# Patient Record
Sex: Female | Born: 1965 | Race: Black or African American | Hispanic: No | Marital: Single | Smoking: Former smoker
Health system: Southern US, Community
[De-identification: ages and names within clinical notes are randomized; demographics above are authoritative.]

## PROBLEM LIST (undated history)

## (undated) DIAGNOSIS — B079 Viral wart, unspecified: Secondary | ICD-10-CM

## (undated) HISTORY — DX: Viral wart, unspecified: B07.9

---

## 1998-08-11 ENCOUNTER — Emergency Department (HOSPITAL_COMMUNITY): Admission: EM | Admit: 1998-08-11 | Discharge: 1998-08-11 | Payer: Self-pay | Admitting: Emergency Medicine

## 1999-11-24 ENCOUNTER — Ambulatory Visit (HOSPITAL_COMMUNITY): Admission: RE | Admit: 1999-11-24 | Discharge: 1999-11-24 | Payer: Self-pay | Admitting: *Deleted

## 1999-11-24 ENCOUNTER — Encounter: Payer: Self-pay | Admitting: *Deleted

## 1999-12-27 ENCOUNTER — Ambulatory Visit (HOSPITAL_COMMUNITY): Admission: RE | Admit: 1999-12-27 | Discharge: 1999-12-27 | Payer: Self-pay | Admitting: *Deleted

## 2000-02-22 ENCOUNTER — Encounter: Admission: RE | Admit: 2000-02-22 | Discharge: 2000-05-22 | Payer: Self-pay | Admitting: *Deleted

## 2000-02-23 ENCOUNTER — Ambulatory Visit (HOSPITAL_COMMUNITY): Admission: RE | Admit: 2000-02-23 | Discharge: 2000-02-23 | Payer: Self-pay | Admitting: *Deleted

## 2000-02-29 ENCOUNTER — Encounter: Admission: RE | Admit: 2000-02-29 | Discharge: 2000-02-29 | Payer: Self-pay | Admitting: Obstetrics

## 2000-03-13 ENCOUNTER — Encounter: Admission: RE | Admit: 2000-03-13 | Discharge: 2000-03-13 | Payer: Self-pay | Admitting: Obstetrics & Gynecology

## 2000-04-25 ENCOUNTER — Encounter: Admission: RE | Admit: 2000-04-25 | Discharge: 2000-04-25 | Payer: Self-pay | Admitting: Obstetrics

## 2000-04-25 ENCOUNTER — Encounter: Payer: Self-pay | Admitting: *Deleted

## 2000-04-25 ENCOUNTER — Inpatient Hospital Stay (HOSPITAL_COMMUNITY): Admission: AD | Admit: 2000-04-25 | Discharge: 2000-04-25 | Payer: Self-pay | Admitting: *Deleted

## 2000-04-25 ENCOUNTER — Encounter (HOSPITAL_COMMUNITY): Admission: RE | Admit: 2000-04-25 | Discharge: 2000-06-03 | Payer: Self-pay | Admitting: Obstetrics

## 2000-05-02 ENCOUNTER — Encounter: Admission: RE | Admit: 2000-05-02 | Discharge: 2000-05-02 | Payer: Self-pay | Admitting: Obstetrics

## 2000-05-15 ENCOUNTER — Encounter: Admission: RE | Admit: 2000-05-15 | Discharge: 2000-05-15 | Payer: Self-pay | Admitting: Obstetrics

## 2000-05-29 ENCOUNTER — Encounter: Admission: RE | Admit: 2000-05-29 | Discharge: 2000-05-29 | Payer: Self-pay | Admitting: Obstetrics

## 2000-06-02 ENCOUNTER — Inpatient Hospital Stay (HOSPITAL_COMMUNITY): Admission: AD | Admit: 2000-06-02 | Discharge: 2000-06-05 | Payer: Self-pay | Admitting: *Deleted

## 2000-06-02 ENCOUNTER — Encounter (INDEPENDENT_AMBULATORY_CARE_PROVIDER_SITE_OTHER): Payer: Self-pay | Admitting: Specialist

## 2000-06-08 ENCOUNTER — Inpatient Hospital Stay (HOSPITAL_COMMUNITY): Admission: AD | Admit: 2000-06-08 | Discharge: 2000-06-08 | Payer: Self-pay | Admitting: *Deleted

## 2004-04-28 ENCOUNTER — Encounter: Admission: RE | Admit: 2004-04-28 | Discharge: 2004-04-28 | Payer: Self-pay | Admitting: Family Medicine

## 2004-04-28 ENCOUNTER — Other Ambulatory Visit: Admission: RE | Admit: 2004-04-28 | Discharge: 2004-04-28 | Payer: Self-pay | Admitting: Family Medicine

## 2004-04-28 ENCOUNTER — Encounter (INDEPENDENT_AMBULATORY_CARE_PROVIDER_SITE_OTHER): Payer: Self-pay | Admitting: Specialist

## 2004-06-06 ENCOUNTER — Encounter: Admission: RE | Admit: 2004-06-06 | Discharge: 2004-06-06 | Payer: Self-pay | Admitting: Family Medicine

## 2006-04-28 ENCOUNTER — Encounter (INDEPENDENT_AMBULATORY_CARE_PROVIDER_SITE_OTHER): Payer: Self-pay | Admitting: *Deleted

## 2006-04-28 LAB — CONVERTED CEMR LAB

## 2006-05-03 ENCOUNTER — Ambulatory Visit: Payer: Self-pay | Admitting: Family Medicine

## 2006-05-03 ENCOUNTER — Other Ambulatory Visit: Admission: RE | Admit: 2006-05-03 | Discharge: 2006-05-03 | Payer: Self-pay | Admitting: Family Medicine

## 2006-06-12 ENCOUNTER — Ambulatory Visit: Payer: Self-pay | Admitting: Family Medicine

## 2006-12-26 DIAGNOSIS — E669 Obesity, unspecified: Secondary | ICD-10-CM | POA: Insufficient documentation

## 2006-12-26 DIAGNOSIS — F17200 Nicotine dependence, unspecified, uncomplicated: Secondary | ICD-10-CM

## 2006-12-27 ENCOUNTER — Encounter (INDEPENDENT_AMBULATORY_CARE_PROVIDER_SITE_OTHER): Payer: Self-pay | Admitting: *Deleted

## 2007-02-23 ENCOUNTER — Emergency Department (HOSPITAL_COMMUNITY): Admission: EM | Admit: 2007-02-23 | Discharge: 2007-02-23 | Payer: Self-pay | Admitting: Emergency Medicine

## 2007-03-05 ENCOUNTER — Telehealth (INDEPENDENT_AMBULATORY_CARE_PROVIDER_SITE_OTHER): Payer: Self-pay | Admitting: *Deleted

## 2007-03-06 ENCOUNTER — Ambulatory Visit: Payer: Self-pay | Admitting: Family Medicine

## 2007-03-06 ENCOUNTER — Encounter (INDEPENDENT_AMBULATORY_CARE_PROVIDER_SITE_OTHER): Payer: Self-pay | Admitting: Family Medicine

## 2007-03-06 DIAGNOSIS — L259 Unspecified contact dermatitis, unspecified cause: Secondary | ICD-10-CM | POA: Insufficient documentation

## 2007-03-06 DIAGNOSIS — I10 Essential (primary) hypertension: Secondary | ICD-10-CM | POA: Insufficient documentation

## 2007-03-06 LAB — CONVERTED CEMR LAB
Calcium: 9.3 mg/dL (ref 8.4–10.5)
Chloride: 105 meq/L (ref 96–112)
Creatinine, Ser: 0.8 mg/dL (ref 0.40–1.20)
Glucose, Urine, Semiquant: NEGATIVE
HCT: 38.3 % (ref 36.0–46.0)
Platelets: 297 10*3/uL (ref 150–400)
Protein, U semiquant: NEGATIVE
RDW: 12.7 % (ref 11.5–14.0)
WBC Urine, dipstick: NEGATIVE
pH: 7

## 2007-03-10 ENCOUNTER — Telehealth (INDEPENDENT_AMBULATORY_CARE_PROVIDER_SITE_OTHER): Payer: Self-pay | Admitting: Family Medicine

## 2007-04-04 ENCOUNTER — Encounter (INDEPENDENT_AMBULATORY_CARE_PROVIDER_SITE_OTHER): Payer: Self-pay | Admitting: *Deleted

## 2007-06-23 ENCOUNTER — Encounter: Payer: Self-pay | Admitting: Family Medicine

## 2007-06-23 ENCOUNTER — Other Ambulatory Visit: Admission: RE | Admit: 2007-06-23 | Discharge: 2007-06-23 | Payer: Self-pay | Admitting: Family Medicine

## 2007-06-23 ENCOUNTER — Ambulatory Visit: Payer: Self-pay | Admitting: Family Medicine

## 2007-06-23 DIAGNOSIS — B079 Viral wart, unspecified: Secondary | ICD-10-CM | POA: Insufficient documentation

## 2007-06-23 HISTORY — DX: Viral wart, unspecified: B07.9

## 2007-06-24 ENCOUNTER — Encounter: Payer: Self-pay | Admitting: Family Medicine

## 2007-06-27 ENCOUNTER — Encounter: Payer: Self-pay | Admitting: Family Medicine

## 2007-07-08 ENCOUNTER — Telehealth: Payer: Self-pay | Admitting: Family Medicine

## 2007-07-14 ENCOUNTER — Encounter: Admission: RE | Admit: 2007-07-14 | Discharge: 2007-07-14 | Payer: Self-pay | Admitting: Sports Medicine

## 2007-07-16 ENCOUNTER — Encounter: Payer: Self-pay | Admitting: Family Medicine

## 2007-07-21 ENCOUNTER — Telehealth: Payer: Self-pay | Admitting: Family Medicine

## 2007-07-23 ENCOUNTER — Encounter (INDEPENDENT_AMBULATORY_CARE_PROVIDER_SITE_OTHER): Payer: Self-pay | Admitting: *Deleted

## 2007-08-14 ENCOUNTER — Encounter (INDEPENDENT_AMBULATORY_CARE_PROVIDER_SITE_OTHER): Payer: Self-pay | Admitting: *Deleted

## 2008-01-26 ENCOUNTER — Encounter (INDEPENDENT_AMBULATORY_CARE_PROVIDER_SITE_OTHER): Payer: Self-pay | Admitting: Family Medicine

## 2008-02-24 ENCOUNTER — Encounter (INDEPENDENT_AMBULATORY_CARE_PROVIDER_SITE_OTHER): Payer: Self-pay | Admitting: Family Medicine

## 2008-02-24 ENCOUNTER — Encounter: Payer: Self-pay | Admitting: *Deleted

## 2008-02-24 ENCOUNTER — Ambulatory Visit: Payer: Self-pay | Admitting: Family Medicine

## 2008-02-24 DIAGNOSIS — N912 Amenorrhea, unspecified: Secondary | ICD-10-CM

## 2008-02-24 LAB — CONVERTED CEMR LAB: Whiff Test: POSITIVE

## 2008-02-26 ENCOUNTER — Encounter (INDEPENDENT_AMBULATORY_CARE_PROVIDER_SITE_OTHER): Payer: Self-pay | Admitting: Family Medicine

## 2008-05-31 ENCOUNTER — Telehealth: Payer: Self-pay | Admitting: *Deleted

## 2008-06-17 ENCOUNTER — Ambulatory Visit: Payer: Self-pay | Admitting: Family Medicine

## 2008-06-17 ENCOUNTER — Telehealth: Payer: Self-pay | Admitting: *Deleted

## 2009-02-15 ENCOUNTER — Telehealth (INDEPENDENT_AMBULATORY_CARE_PROVIDER_SITE_OTHER): Payer: Self-pay | Admitting: *Deleted

## 2009-07-12 ENCOUNTER — Ambulatory Visit: Payer: Self-pay | Admitting: Family Medicine

## 2009-07-12 DIAGNOSIS — M545 Low back pain: Secondary | ICD-10-CM

## 2009-08-10 ENCOUNTER — Ambulatory Visit: Payer: Self-pay | Admitting: Family Medicine

## 2009-08-10 DIAGNOSIS — K5900 Constipation, unspecified: Secondary | ICD-10-CM | POA: Insufficient documentation

## 2009-10-14 ENCOUNTER — Emergency Department (HOSPITAL_COMMUNITY): Admission: EM | Admit: 2009-10-14 | Discharge: 2009-10-15 | Payer: Self-pay | Admitting: Emergency Medicine

## 2009-10-26 ENCOUNTER — Ambulatory Visit: Payer: Self-pay | Admitting: Family Medicine

## 2009-10-26 DIAGNOSIS — F410 Panic disorder [episodic paroxysmal anxiety] without agoraphobia: Secondary | ICD-10-CM

## 2010-08-02 ENCOUNTER — Encounter: Payer: Self-pay | Admitting: Family Medicine

## 2010-08-02 ENCOUNTER — Ambulatory Visit: Payer: Self-pay | Admitting: Family Medicine

## 2010-08-03 ENCOUNTER — Ambulatory Visit: Payer: Self-pay | Admitting: Family Medicine

## 2010-08-03 ENCOUNTER — Encounter: Payer: Self-pay | Admitting: Family Medicine

## 2010-08-03 LAB — CONVERTED CEMR LAB
BUN: 12 mg/dL (ref 6–23)
CO2: 28 meq/L (ref 19–32)
Chloride: 100 meq/L (ref 96–112)
Creatinine, Ser: 0.79 mg/dL (ref 0.40–1.20)
Glucose, Bld: 134 mg/dL — ABNORMAL HIGH (ref 70–99)
LDL Cholesterol: 95 mg/dL (ref 0–99)

## 2010-08-15 ENCOUNTER — Encounter: Admission: RE | Admit: 2010-08-15 | Discharge: 2010-08-15 | Payer: Self-pay | Admitting: Family Medicine

## 2010-08-30 ENCOUNTER — Other Ambulatory Visit: Admission: RE | Admit: 2010-08-30 | Discharge: 2010-08-30 | Payer: Self-pay | Admitting: Family Medicine

## 2010-08-30 ENCOUNTER — Ambulatory Visit: Payer: Self-pay | Admitting: Family Medicine

## 2010-08-30 ENCOUNTER — Encounter: Payer: Self-pay | Admitting: Family Medicine

## 2010-08-30 LAB — CONVERTED CEMR LAB
Chlamydia, DNA Probe: NEGATIVE
Pap Smear: NEGATIVE

## 2010-10-03 ENCOUNTER — Encounter: Payer: Self-pay | Admitting: Family Medicine

## 2010-11-06 ENCOUNTER — Ambulatory Visit
Admission: RE | Admit: 2010-11-06 | Discharge: 2010-11-06 | Payer: Self-pay | Source: Home / Self Care | Attending: Family Medicine | Admitting: Family Medicine

## 2010-11-28 NOTE — Assessment & Plan Note (Signed)
Summary: PHYSICAL   Vital Signs:  Patient profile:   45 year old female Height:      65 inches Weight:      224.1 pounds BMI:     37.43 Temp:     98.1 degrees F oral Pulse rate:   86 / minute BP sitting:   111 / 79  (left arm) Cuff size:   regular  Vitals Entered By: Garen Grams LPN (August 02, 2010 9:23 AM) CC: cpe Is Patient Diabetic? No Pain Assessment Patient in pain? no        Primary Care Provider:  Ardeen Garland  MD  CC:  cpe.  History of Present Illness:   panic attacks: States that they are infrequent.  She has only 1 per month.  They usually are in the night and she awakens feeling very anxious.   She denies and CP with episodes.  But states she sometimes feels sob.  She states that she is not an "anxious" person at baseline.  and on most days have no symptoms.  Pt endorse some problems sleeping and having nighttime awakening.  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  BP: 111/79.  states she takes bp medication as directed  weight: Pt states she knows that she needs to lose weight.  She plans on joining gym next week.  States that she would consider going to nutritionist.  She requests weight loss medication but after discussing the side effects and the fact that most people regain all of the weight after stopping the medication she agrees that this isn't a good idea.  Goal weight is 155.  current bmi is 36.2.  cigarette smoking: smokes 6 cig per day.  has thought about quiting.  prevention: refused flu shot, states that she would like to get uptodate on her mammogram  Habits & Providers  Alcohol-Tobacco-Diet     Alcohol drinks/day: <1     Tobacco Status: current     Tobacco Counseling: to quit use of tobacco products     Cigarette Packs/Day: <0.25  Allergies (verified): No Known Drug Allergies  Physical Exam  General:   VSS Well-developed,well-nourished,in no acute distress; alert,appropriate and cooperative throughout examination Lungs:  Normal respiratory effort, chest expands symmetrically. Lungs are clear to auscultation, no crackles or wheezes. Heart:  Normal rate and regular rhythm. S1 and S2 normal without gallop, murmur, click, rub or other extra sounds. Msk:  moving all 4 extremities, 5/5 strength Pulses:  2+ Extremities:  no edema Psych:  Cognition and judgment appear intact. Alert and cooperative with normal attention span and concentration. No apparent delusions, illusions, hallucinations   Impression & Recommendations:  Problem # 1:  PANIC ATTACK (ICD-300.01) Will agree to continue as prescribed by previous provider.  Since this pt has only 1 episode per month she shouldn't need another refill for 1 year.  If she has an increased frequency of episodes and requests more alprazolam, would need to talk to pt more about therapy and consider SSRI.    Her updated medication list for this problem includes:    Alprazolam 0.25 Mg Tabs (Alprazolam) .Marland Kitchen... 1 tab by mouth as needed panic attack  Orders: [facility]- Est  Level 4 (16109)  Problem # 2:  PREVENTIVE HEALTH CARE (ICD-V70.0) Pt will go for mammogram.  She is to make this appt.  Will order labs- cholesterol and BMET  for her to get when fasting.  Pt to return in 1 month for pap so should return prior to her next appt for these labs so we can go over results at her next visit.   Orders: [facility]- Est  Level 4 (99214)  Problem # 3:  OBESITY, NOS (ICD-278.00) pt goal weight 155.  Pt currently 224.  Pt plans to make an appt with the nutritionist to discuss weight loss and nutrition. Will call this week.  Pt plans to join gym next week.    Orders: [facility]- Est  Level 4 (99214)Future Orders: Lipid-[facility] ([id number]) ... 08/30/2011  Problem # 4:  TOBACCO DEPENDENCE (ICD-305.1) Encouraged pt to stop smoking.  Encouraged her to get an appt with Dr. Pascal Lux to have one  on one smoking cessation class. Orders: [facility]- Est  Level 4 (09811)  Problem # 5:  HYPERTENSION, BENIGN ESSENTIAL (ICD-401.1) Well controlled.  Will obtain BMET to check renal function. Her updated medication list for this problem includes:    Hydrochlorothiazide 25 Mg Tabs (Hydrochlorothiazide) .Marland Kitchen... Take 1 tablet by mouth once a day    Metoprolol Succinate 50 Mg Xr24h-tab (Metoprolol succinate) .Marland Kitchen... 1 tab by mouth daily for high blood pressure  Orders: [facility]- Est  Level 4 (99214)Future Orders: Basic Met-[facility] ([id number]) ... 08/02/2011  Complete Medication List: 1)  Hydrochlorothiazide 25 Mg Tabs (Hydrochlorothiazide) .... Take 1 tablet by mouth once a day 2)  Metoprolol Succinate 50 Mg Xr24h-tab (Metoprolol succinate) .Marland Kitchen.. 1 tab by mouth daily for high blood pressure 3)  Alprazolam 0.25 Mg Tabs (Alprazolam) .Marland Kitchen.. 1 tab by mouth as needed panic attack  Other Orders: Mammogram (Screening) (Mammo)  Patient Instructions: 1)  Stop smoking- consider making an appt with Dr. Pascal Lux for smoking cessation 2)  For weight loss your goals are: 3)  To meet with our nutritionist- call for an appt- this week 4)  To get involved with the gym next week 5)  Return in 4-6 weeks for pap smear and to follow up for smoking and weight loss 6)  Come in fasting one day for lab work- call ahead to let know what day Prescriptions: ALPRAZOLAM 0.25 MG TABS (ALPRAZOLAM) 1 tab by mouth as needed panic attack  #30 x 0   Entered and Authorized by:   Ellin Mayhew MD   Signed by:   Ellin Mayhew MD on 08/02/2010   Method used:   Print then Give to Patient   RxID:   [id number]    Prevention & Chronic Care Immunizations   Influenza vaccine: Not documented    Tetanus booster: 06/02/2004: Done.    Pneumococcal vaccine: Not documented  Other Screening   Pap smear: Done.  (04/28/2006)    Mammogram: Not documented   Mammogram action/deferral: Ordered  (08/02/2010)   Smoking status: current   (08/02/2010)   Smoking cessation counseling: yes  (06/17/2008)  Lipids   Total Cholesterol: Not documented   LDL: Not documented   LDL Direct: Not documented   HDL: Not documented   Triglycerides: Not documented  Hypertension   Last Blood Pressure: 111 / 79  (08/02/2010)   Serum creatinine: 0.80  (03/06/2007)   Serum potassium 4.2  (03/06/2007)  Self-Management Support :   Personal Goals (by the next clinic visit) :      Personal blood pressure goal: 140/90  (07/12/2009)   Hypertension self-management support: Not documented    Hypertension self-management support not done because: Not indicated  (10/26/2009)   Nursing Instructions: Schedule screening mammogram (see order)

## 2010-11-28 NOTE — Miscellaneous (Signed)
Summary: [facility] form  Clinical Lists Changes  Orders: Added new Test order of Mammogram (Screening) (Mammo) - Signed Added new Referral order of Nutrition Referral (Nutrition) - Signed Observations: Added new observation of DIABDIETCOMM: nutrition counseling for obesity (08/02/2010 15:27) Added new observation of HTN: HTN (08/02/2010 15:27) Added new observation of LIPSMSUPP: Referred for medical nutrition therapy (08/02/2010 15:27) Added new observation of HTNSMSUPP: Referred for medical nutrition therapy (08/02/2010 15:27) Added new observation of DMSMSUPP: Referred for medical nutrition therapy (08/02/2010 15:27) Added new observation of HTN PROGRESS: At goal (08/02/2010 15:27) Added new observation of HTN FSREVIEW: Yes (08/02/2010 15:27) Added new observation of MAMMRECACT: Ordered (08/02/2010 15:27) Added new observation of NURSEINSTR: Schedule screening mammogram (see order) Refer for medical nutrition therapy (see order)  (08/02/2010 15:27) Added new observation of PAP DUE: 10/07/2010 (08/02/2010 15:27) Added new observation of FLUVAXDECLN: Refused (08/02/2010 15:27) Added new observation of DM PROGRESS: N/A (08/02/2010 15:27) Added new observation of DM FSREVIEW: N/A (08/02/2010 15:27) Added new observation of LIPID PROGRS: N/A (08/02/2010 15:27) Added new observation of LIPID FSREVW: N/A (08/02/2010 15:27)      Prevention & Chronic Care Immunizations   Influenza vaccine: Not documented   Influenza vaccine deferral: Refused  (08/02/2010)    Tetanus booster: 06/02/2004: Done.    Pneumococcal vaccine: Not documented  Other Screening   Pap smear: Done.  (04/28/2006)   Pap smear due: 10/07/2010    Mammogram: Not documented   Mammogram action/deferral: Ordered  (08/02/2010)   Smoking status: current  (08/02/2010)   Smoking cessation counseling: yes  (06/17/2008)  Lipids   Total Cholesterol: Not documented   LDL: Not documented   LDL Direct: Not documented   HDL:  Not documented   Triglycerides: Not documented  Hypertension   Last Blood Pressure: 111 / 79  (08/02/2010)   Serum creatinine: 0.80  (03/06/2007)   Serum potassium 4.2  (03/06/2007)    Hypertension flowsheet reviewed?: Yes   Progress toward BP goal: At goal  Self-Management Support :   Personal Goals (by the next clinic visit) :      Personal blood pressure goal: 140/90  (07/12/2009)  Referred.    Hypertension self-management support: Referred for medical nutrition therapy  (08/02/2010)    Hypertension self-management support not done because: Not indicated  (10/26/2009)   Nursing Instructions: Schedule screening mammogram (see order) Refer for medical nutrition therapy (see order)    Diabetes Self Management Training Referral Patient Name: Cheyenne Mcintyre Date Of Birth: 1966-08-03 MRN: [id number] Current Diagnosis:  PANIC ATTACK (ICD-300.01) CONSTIPATION (ICD-564.00) BACK PAIN, LUMBAR (ICD-724.2) CONTACT OR EXPOSURE TO OTHER VIRAL DISEASES (ICD-V01.79) AMENORRHEA (ICD-626.0) PREVENTIVE HEALTH CARE (ICD-V70.0) VERRUCA VULGARIS (ICD-078.10) DERMATITIS (ICD-692.9) HYPERTENSION, BENIGN ESSENTIAL (ICD-401.1) TOBACCO DEPENDENCE (ICD-305.1) OBESITY, NOS (ICD-278.00)     Management Training Needs:  Please Specify change in Medical condition, treatment or diagnosis nutrition counseling for obesity  Complicating Conditions:  HTN

## 2010-11-28 NOTE — Miscellaneous (Signed)
  Clinical Lists Changes  Problems: Removed problem of SEXUALLY TRANSMITTED DISEASE, EXPOSURE TO (ICD-V01.6) Removed problem of CONTACT OR EXPOSURE TO OTHER VIRAL DISEASES (ICD-V01.79) Removed problem of PREVENTIVE HEALTH CARE (ICD-V70.0)      Allergies: No Known Drug Allergies

## 2010-11-28 NOTE — Assessment & Plan Note (Signed)
Summary: pap/smoking/obesity   Vital Signs:  Patient profile:   45 year old female Weight:      228.5 pounds Pulse rate:   96 / minute BP sitting:   115 / 80  (right arm)  Vitals Entered By: Starleen Blue RN (August 30, 2010 3:39 PM) CC: pap Is Patient Diabetic? No Pain Assessment Patient in pain? no        Primary Care Provider:  Ardeen Garland  MD  CC:  pap.  History of Present Illness: Pt here for pap: Pt had to leave appt early at last visit and we were not able to do it at last appt.  Requests that pap be done today.  smoking: pt is smoking 6-10 cigarettes per day.  Pt states that she knows that she needs to stop.  unprotected sex: Pt states that she has had unprotected sex and would like to be screened for STD's at todays visit.    obesity: Pt states that she knows that she is overweight.  States understanding that weight loss could help control her triglyceride level.  Pt states that she is not currently engaged in   Habits & Providers  Alcohol-Tobacco-Diet     Alcohol drinks/day: <1     Tobacco Status: current     Tobacco Counseling: to quit use of tobacco products     Cigarette Packs/Day: <0.25  Current Medications (verified): 1)  Hydrochlorothiazide 25 Mg Tabs (Hydrochlorothiazide) .... Take 1 Tablet By Mouth Once A Day 2)  Metoprolol Succinate 50 Mg Xr24h-Tab (Metoprolol Succinate) .Marland Kitchen.. 1 Tab By Mouth Daily For High Blood Pressure 3)  Alprazolam 0.25 Mg Tabs (Alprazolam) .Marland Kitchen.. 1 Tab By Mouth As Needed Panic Attack  Allergies (verified): No Known Drug Allergies  Review of Systems       as per hpi  Physical Exam  General:  VSS Well-developed,well-nourished,in no acute distress; alert,appropriate and cooperative throughout examination Lungs:  Normal respiratory effort, chest expands symmetrically. Lungs are clear to auscultation, no crackles or wheezes. Heart:  Normal rate and regular rhythm. S1 and S2 normal without gallop, murmur, click, rub or  other extra sounds. Genitalia:  Pelvic Exam:        External: normal female genitalia without lesions or masses        Vagina: normal without lesions or masses        Cervix: normal without lesions or masses        Adnexa: normal bimanual exam without masses or fullness        Uterus: normal by palpation        Pap smear: performed Extremities:  no edema Skin:  Intact without suspicious lesions or rashes   Impression & Recommendations:  Problem # 1:  SEXUALLY TRANSMITTED DISEASE, EXPOSURE TO (ICD-V01.6) pt tested for GC/Chlam, HIV, and RPR since she had recent unprotected sex.   reviewed the importance of using protection.   Orders: GC/Chlamydia-[facility] ([id number]/[id number]) [facility]- Est  Level 4 (99214)Future Orders: HIV-[facility] ([id number]) ... 09/13/2011 RPR-[facility] [id number]) ... 09/12/2011  Problem # 2:  Screening Cervical Cancer (ICD-V76.2) pap test obtained and sent to lab.   Problem # 3:  TOBACCO DEPENDENCE (ICD-305.1) pt encouraged to quit.  Orders: [facility]- Est  Level 4 (29562)  Problem # 4:  OBESITY, NOS (ICD-278.00) Pt did not follow through on plan to meet with nutritionist or join gym.  Pt encouraged to start physical activity and make appt with the nutritionist.    Orders: Pap Smear-[facility] ([id number]) [facility]- Est  Level 4 (  16109)  Complete Medication List: 1)  Hydrochlorothiazide 25 Mg Tabs (Hydrochlorothiazide) .... Take 1 tablet by mouth once a day 2)  Metoprolol Succinate 50 Mg Xr24h-tab (Metoprolol succinate) .Marland Kitchen.. 1 tab by mouth daily for high blood pressure 3)  Alprazolam 0.25 Mg Tabs (Alprazolam) .Marland Kitchen.. 1 tab by mouth as needed panic attack  Patient Instructions: 1)  I will call you if you have any abnormal results come back.  2)  Consider stopping smoking:1-800-quit-now could be helpful 3)  follow up in 6 months for bp and recheck weight and smoking status.    Orders Added: 1)  GC/Chlamydia-[facility] [[id number]] 2)  Pap Smear-[facility] [[id number]] 3)  [facility]- Est  Level 4  [99214] 4)  HIV-[facility] [[id number]] 5)  RPR-[facility] [[id number]]     Prevention & Chronic Care Immunizations   Influenza vaccine: Not documented   Influenza vaccine deferral: Refused  (08/02/2010)    Tetanus booster: 06/02/2004: Done.    Pneumococcal vaccine: Not documented  Other Screening   Pap smear: Done.  (04/28/2006)   Pap smear due: 10/07/2010    Mammogram: ASSESSMENT: Negative - BI-RADS 1^MM DIGITAL SCREENING  (08/15/2010)   Mammogram action/deferral: Ordered  (08/02/2010)   Smoking status: current  (08/30/2010)   Smoking cessation counseling: yes  (06/17/2008)  Lipids   Total Cholesterol: 170  (08/03/2010)   LDL: 95  (08/03/2010)   LDL Direct: Not documented   HDL: 44  (08/03/2010)   Triglycerides: 156  (08/03/2010)  Hypertension   Last Blood Pressure: 115 / 80  (08/30/2010)   Serum creatinine: 0.79  (08/03/2010)   Serum potassium 4.1  (08/03/2010)    Hypertension flowsheet reviewed?: Yes  Self-Management Support :   Personal Goals (by the next clinic visit) :      Personal blood pressure goal: 140/90  (07/12/2009)   Hypertension self-management support: Written self-care plan  (08/30/2010)   Hypertension self-care plan printed.    Hypertension self-management support not done because: Not indicated  (10/26/2009)   Vital Signs:  Patient profile:   45 year old female Weight:      228.5 pounds Pulse rate:   96 / minute BP sitting:   115 / 80  (right arm)  Vitals Entered By: Starleen Blue RN (August 30, 2010 3:39 PM)

## 2010-11-30 NOTE — Assessment & Plan Note (Signed)
Summary: BP CHECK/KH  Nurse Visit   Vital Signs:  Patient profile:   45 year old female Pulse rate:   76 / minute BP sitting:   142 / 98  (right arm) Cuff size:   large  Vitals Entered By: Tessie Fass CMA (November 06, 2010 2:50 PM) CC: BP check. Pt says she has not been taking BP meds regularly because BP was normal, but will start taking on daily basis.   Allergies: No Known Drug Allergies  Orders Added: 1)  No Charge Patient Arrived (NCPA0) [NCPA0]

## 2010-12-05 ENCOUNTER — Encounter: Payer: Self-pay | Admitting: *Deleted

## 2010-12-28 ENCOUNTER — Ambulatory Visit (INDEPENDENT_AMBULATORY_CARE_PROVIDER_SITE_OTHER): Payer: Medicaid Other | Admitting: *Deleted

## 2010-12-28 VITALS — BP 116/80 | HR 80

## 2010-12-28 DIAGNOSIS — I10 Essential (primary) hypertension: Secondary | ICD-10-CM

## 2010-12-29 NOTE — Progress Notes (Signed)
IN for BP check on 12/28/2010 . BP checked manually using large adult cuff. BP LA 120/80, RA 116/80 pulse 80. Patient reports headache off and on for several days. Advised if continuing to come in to see MD. Advised of need to schedule appointment for follow up.

## 2011-01-29 LAB — URINALYSIS, ROUTINE W REFLEX MICROSCOPIC
Nitrite: NEGATIVE
Specific Gravity, Urine: 1.019 (ref 1.005–1.030)
Urobilinogen, UA: 0.2 mg/dL (ref 0.0–1.0)
pH: 6.5 (ref 5.0–8.0)

## 2011-01-29 LAB — POCT I-STAT, CHEM 8
Chloride: 104 mEq/L (ref 96–112)
HCT: 35 % — ABNORMAL LOW (ref 36.0–46.0)
Hemoglobin: 11.9 g/dL — ABNORMAL LOW (ref 12.0–15.0)
Potassium: 3.5 mEq/L (ref 3.5–5.1)

## 2011-01-29 LAB — POCT PREGNANCY, URINE: Preg Test, Ur: NEGATIVE

## 2011-03-16 NOTE — Op Note (Signed)
[facility]  Patient:    Cheyenne Mcintyre, Cheyenne Mcintyre                      MRN: [id number] Proc. Date: 06/03/00 Adm. Date:  [id number] Disc. Date: [id number] Attending:  Tammi Sou                           Operative Report  PREOPERATIVE DIAGNOSIS:  40+ week intrauterine pregnancy, gestational diabetes, arrest of dilatation active phase, meconium stained amniotic fluid, persistent occiput posterior.  POSTOPERATIVE DIAGNOSIS:  40+ week intrauterine pregnancy, gestational diabetes, arrest of dilatation active phase, meconium stained amniotic fluid, persistent occiput posterior.  Macrosomia.  OPERATION:  Primary low transverse cesarean and modified Pomeroy bilateral tubal ligation via Pfannenstiel incision.  SURGEON:  Roseanna Rainbow, M.D.  ANESTHESIA:  Epidural.  COMPLICATIONS:  None.  ESTIMATED BLOOD LOSS:  800 cc.  FLUIDS:  1500 cc lactated Ringers.  URINE OUTPUT:  300 cc of clear urine.  INDICATIONS:  The patient is a 45 year old multipara at 40+ weeks with arrest of dilatation at 8 to 9 cm and prolonged decelerations with oxytocin.  FINDINGS:  Infant in cephalic presentation.  Meconium with none below the cords.  Pediatrics present at delivery.  Umbilical artery pH 7.26.  Weight 9 pounds, 7 ounces.  Normal uterus, tubes and ovaries.  DESCRIPTION OF PROCEDURE:  The patient was taken to the operating room where epidural anesthetic was found to be adequate.  She was then prepped and draped in normal sterile fashion in dorsal supine position with leftward tilt.  A Pfannenstiel skin ncision was then made with the scalpel and carried through to the underlying layer of fascia.  The fascia was incised in the midline and the incision extended laterally with Mayo scissors.  The superior aspect of the fascial incision was then grasped with Kocher clamps, elevated and the underlying rectus muscles dissected off.  Attention was then turned to  the inferior aspect of the incision which was manipulatd in similar fashion.  The rectus muscles were separated in the midline and the parietal peritoneum identified, tented up and entered sharply with Metzenbaum scissors.  The peritoneal incision was then extended superiorly and inferiorly with good visualization of the bladder.  The bladder blade was then inserted and the vesicouterine peritoneum identified and grasped with the pickups and entered sharply with Metzenbaum scissors.  This incision was then extended laterally and the bladder flap created digitally.  The bladder blade was then reinserted and the lower uterine segment incised in a transverse fashion with the scalpel.  The uterine incision was then extended bluntly.  The bladder blade was removed and the infants head delivered atraumatically.  The nose and mouth were suctioned with the DeLee suction trap and the cord clamped and cut. The infant was handed off to the awaiting pediatricians.  Cord gases were sent.  The placenta was removed.  The uterus was exteriorized, and cleared of all clots and debris.  The uterine incision was then repaired with 0 Monocryl in a running locked fashio.  Interrupted figure-of-eight sutures of the same were used to obtain excellent hemostasis.  Attention was then turned to the fallopian tubes.  The left fallopian tube was then grasped approximately 4 cm away from the cornua.  A 2 cm segment of tube was then doubly ligated with free ties of plain gut and excised.  The right fallopian tube was manipulated in a similar  fashion.  The uterus was then returned to the abdomen and the gutters were cleared of all clots.  The tubal ligatures were then visualized for hemostasis.  One of the right ligatures was felt to be loose and a third ligature was placed for security.  Excellent hemostasis was noted. The fascia was reapproximated with 0 Vicryl.  The skin was closed with staples.  The patient tolerated  the procedure well.  Sponge, lap and needle counts were correct x 2.  The patient was taken to the post anesthesia care unit in stable condition. DD:  06/03/00 TD:  06/04/00 Job: [id number]

## 2011-03-16 NOTE — Discharge Summary (Signed)
[facility]  Patient:    Cheyenne Mcintyre, Cheyenne Mcintyre                      MRN: [id number] Adm. Date:  [id number] Disc. Date: [id number] Attending:  Michaelle Copas Dictator:   Pricilla Holm, M.D.                           Discharge Summary  ADMISSION DIAGNOSIS:          A 45 year old gravida 7, para 3, 0 0 3 at 40-5/7 weeks presents with contractions.  DISCHARGE DIAGNOSIS:          Delivery of a viable female via cesarean section and bilateral tubal ligation.  PROCEDURES:                   Low transverse C-section and bilateral salpingo-oophorectomy.  HOSPITAL COURSE:              Ms. [name] is 45 year old G7, P3, 0 0 3 that presented at 40-5/7 weeks by first-trimester ultrasound on August 02, 2000. She was complaining of contractions.  She came in to be evaluated.  The patient was admitted to L&D and was found to have a group B Strep positive finding.  The patient labored without any problems and was found to eventually have meconium-stained amniotic fluid after her water broke.  The patient also has gestational diabetes that has been complicating her pregnancy.  She eventually ended having arrest of her dilatation, active phase, coupled with the meconium-stained amniotic fluid and persistent occipitoposterior and macrosomia.  The patient was decided to be taken back for a low transverse C-section and modified Pomeroy bilateral tubal ligation via Pfannenstiel incision.  The above operative procedures were performed by Dr. Roseanna Rainbow under epidural anesthesia without complications.  The patient delivered a viable female via C-section weighing 9 pounds 7 ounces, length 22 inches, along with Apgars 9 at one minute and 9 at five minutes.  The patient tolerated the C-section and bilateral salpingo-oophorectomy without any problems.  She received routine post C-section care.  The patient has been both breast and bottle feeding.  She was found to  have marijuana in her urine drug screen and has subsequently been supplementing the baby with formula. The babys urine was found to be negative and a stool sample was sent for drug screen.  The patient was found to be in good condition on her postop day #3 and it was deemed appropriate to send her home.  Her hemoglobin was 11.7 with a 33.3 hematocrit on June 02, 2000 and hemoglobin of 9.2 and a hematocrit of 26.2 on June 03, 2000.  She will be following up in six weeks for her postpartum visit with womens health and the patient has been given instructions for how to care for her incision and will return in three days to have her staples removed.  DISCHARGE INSTRUCTIONS:       The patient was given discharge instructions. She voiced agreement and understanding of the instructions given.  DISCHARGE MEDICATIONS:        She was given a prescription for Vicodin 1-2 tablets q.6h. p.r.n. pain #15.  FOLLOW-UP:                    The patient voiced agreement and understanding and will return in three days for staple removal. DD:  06/05/00 TD:  06/06/00 Job: [id number]

## 2011-03-27 ENCOUNTER — Ambulatory Visit (INDEPENDENT_AMBULATORY_CARE_PROVIDER_SITE_OTHER): Payer: Medicaid Other | Admitting: Family Medicine

## 2011-03-27 ENCOUNTER — Encounter: Payer: Self-pay | Admitting: Family Medicine

## 2011-03-27 VITALS — BP 148/98 | HR 93 | Temp 98.2°F | Ht 65.0 in | Wt 222.0 lb

## 2011-03-27 DIAGNOSIS — N898 Other specified noninflammatory disorders of vagina: Secondary | ICD-10-CM

## 2011-03-27 DIAGNOSIS — A5901 Trichomonal vulvovaginitis: Secondary | ICD-10-CM

## 2011-03-27 DIAGNOSIS — N760 Acute vaginitis: Secondary | ICD-10-CM | POA: Insufficient documentation

## 2011-03-27 LAB — POCT WET PREP (WET MOUNT): Yeast Wet Prep HPF POC: NEGATIVE

## 2011-03-27 NOTE — Progress Notes (Signed)
  Subjective:    Patient ID: Cheyenne Mcintyre, female    DOB: [birth year], 45 y.o.   MRN: [id number]  HPI Pt is here for STDs concerns: She is sexually active with one partner of several years.  Does no use condoms.  Found out that her partner has been cheating on her.  He denies having any STDs.  She has history of Gonorhea in the distant past and was treated.  She is still in the relationship with this partner, but thinks that they are breaking up.  She endorses vaginal discharge, that is clear.  She is not sure how many days she's had the discharge.  She is not sure if she's seeing the discharge since finding out about her partner's indiscretion.  LMP: 5/17 - 5/22.  BTL 2001.  She declined HIV, RPR testing today.    Pt would like to be informed by cell phone: [phone].  Ok to leave message.   Review of Systems  Constitutional: Negative for fever, chills and unexpected weight change.  Respiratory: Negative for cough and shortness of breath.   Cardiovascular: Negative for chest pain and leg swelling.  Gastrointestinal: Negative for nausea, vomiting and diarrhea.  Genitourinary: Positive for vaginal discharge. Negative for dysuria, hematuria, vaginal bleeding, genital sores, vaginal pain, menstrual problem, pelvic pain and dyspareunia.       Objective:   Physical Exam  Constitutional: She appears well-developed and well-nourished. No distress.  Abdominal: Soft. Bowel sounds are normal. She exhibits no distension. There is no tenderness.  Genitourinary: There is no rash, tenderness or lesion on the right labia. There is no rash, tenderness or lesion on the left labia. Cervix exhibits discharge. Cervix exhibits no motion tenderness. Right adnexum displays no mass and no tenderness. Left adnexum displays no mass and no tenderness. No erythema, tenderness or bleeding around the vagina. No foreign body around the vagina. Vaginal discharge found.  Lymphadenopathy:       Right: No inguinal  adenopathy present.       Left: No inguinal adenopathy present.          Assessment & Plan:

## 2011-03-27 NOTE — Assessment & Plan Note (Signed)
Pt with concern for STD since finding out that her partner had been sexually indiscreet. She endorsed vaginal discharge.  Wet prep showed +trich.  We also obtained culture for GC/Chlam.  She declined testing for HIV and RPR. Will wait for result of GC/Chalm (likely to be resulted tomorrow) and will treat at the same time as Trich (Flagyl 2000mg  x 1).  Ok to leave message on her cell [phone].

## 2011-03-28 ENCOUNTER — Other Ambulatory Visit: Payer: Self-pay | Admitting: Family Medicine

## 2011-03-28 MED ORDER — METRONIDAZOLE 500 MG PO TABS: ORAL_TABLET | ORAL | Status: AC

## 2011-05-03 ENCOUNTER — Ambulatory Visit: Payer: Medicaid Other | Admitting: Family Medicine

## 2011-05-10 ENCOUNTER — Ambulatory Visit (INDEPENDENT_AMBULATORY_CARE_PROVIDER_SITE_OTHER): Payer: Medicaid Other | Admitting: Family Medicine

## 2011-05-10 ENCOUNTER — Encounter: Payer: Self-pay | Admitting: Family Medicine

## 2011-05-10 DIAGNOSIS — N760 Acute vaginitis: Secondary | ICD-10-CM

## 2011-05-10 DIAGNOSIS — N888 Other specified noninflammatory disorders of cervix uteri: Secondary | ICD-10-CM

## 2011-05-10 DIAGNOSIS — Z8619 Personal history of other infectious and parasitic diseases: Secondary | ICD-10-CM | POA: Insufficient documentation

## 2011-05-10 DIAGNOSIS — Z202 Contact with and (suspected) exposure to infections with a predominantly sexual mode of transmission: Secondary | ICD-10-CM

## 2011-05-10 NOTE — Progress Notes (Signed)
  Subjective:    Patient ID: Cheyenne Mcintyre, female    DOB: 15-Jun-1966, 45 y.o.   MRN: [id number]  HPI Pt requesting recheck for trich: No sex since treatment. Wants to recheck to make sure trich is gone.  No fever. No vaginal discharge.  No foul odor.  Took flagyl as directed for treatment of trich.   Refuses further testing for GC/Chlam since negative at last check and has not had sex since them.  Also had to leave prior to lab draw for hiv and syphilis.    Review of Systems As per above    Objective:   Physical Exam  Constitutional: She appears well-developed and well-nourished.  Pulmonary/Chest: Effort normal. No respiratory distress.  Genitourinary: Vagina normal and uterus normal. There is no rash or lesion on the right labia. There is no rash or lesion on the left labia. Cervix exhibits discharge. Cervix exhibits no motion tenderness and no friability.       Wet prep- negative for trich   Assessment & Plan:

## 2011-05-10 NOTE — Assessment & Plan Note (Signed)
No trichomonas on recheck of wet prep today.  Pt notified of results by phone.

## 2011-05-11 ENCOUNTER — Encounter: Payer: Self-pay | Admitting: Family Medicine

## 2011-06-24 ENCOUNTER — Other Ambulatory Visit: Payer: Self-pay | Admitting: Family Medicine

## 2011-06-25 NOTE — Telephone Encounter (Signed)
Refill request

## 2011-07-27 ENCOUNTER — Emergency Department (HOSPITAL_COMMUNITY)
Admission: EM | Admit: 2011-07-27 | Discharge: 2011-07-27 | Disposition: A | Discharge status: Home / Self Care | Payer: No Typology Code available for payment source | Attending: Emergency Medicine | Admitting: Emergency Medicine

## 2011-07-27 ENCOUNTER — Emergency Department (HOSPITAL_COMMUNITY): Payer: No Typology Code available for payment source

## 2011-07-27 DIAGNOSIS — R0789 Other chest pain: Secondary | ICD-10-CM | POA: Insufficient documentation

## 2011-07-27 DIAGNOSIS — R0609 Other forms of dyspnea: Secondary | ICD-10-CM | POA: Insufficient documentation

## 2011-07-27 DIAGNOSIS — M542 Cervicalgia: Secondary | ICD-10-CM | POA: Insufficient documentation

## 2011-07-27 DIAGNOSIS — I10 Essential (primary) hypertension: Secondary | ICD-10-CM | POA: Insufficient documentation

## 2011-07-27 DIAGNOSIS — R0989 Other specified symptoms and signs involving the circulatory and respiratory systems: Secondary | ICD-10-CM | POA: Insufficient documentation

## 2011-07-30 ENCOUNTER — Ambulatory Visit: Payer: Medicaid Other

## 2011-07-31 ENCOUNTER — Ambulatory Visit: Payer: Medicaid Other

## 2011-09-28 ENCOUNTER — Ambulatory Visit (INDEPENDENT_AMBULATORY_CARE_PROVIDER_SITE_OTHER): Payer: No Typology Code available for payment source | Admitting: Family Medicine

## 2011-09-28 ENCOUNTER — Encounter: Payer: Self-pay | Admitting: Family Medicine

## 2011-09-28 VITALS — BP 140/92 | HR 90 | Temp 98.3°F | Ht 65.0 in | Wt 226.0 lb

## 2011-09-28 DIAGNOSIS — N760 Acute vaginitis: Secondary | ICD-10-CM

## 2011-09-28 DIAGNOSIS — A599 Trichomoniasis, unspecified: Secondary | ICD-10-CM | POA: Insufficient documentation

## 2011-09-28 DIAGNOSIS — Z7251 High risk heterosexual behavior: Secondary | ICD-10-CM | POA: Insufficient documentation

## 2011-09-28 LAB — POCT WET PREP (WET MOUNT)

## 2011-09-28 MED ORDER — METRONIDAZOLE 500 MG PO TABS: ORAL_TABLET | ORAL | Status: AC

## 2011-09-28 NOTE — Assessment & Plan Note (Signed)
Counseled on condom use and need to protect herself against viral illnesses and not just vaginitis. Will check wet prep and cultures for gonorrhea and Chlamydia. She declines HIV and syphilis test today.

## 2011-09-28 NOTE — Assessment & Plan Note (Signed)
Discussed safe sex, alerting partner and him getting treated, avoiding sexual contact until both are treated, risks of pid.

## 2011-09-28 NOTE — Progress Notes (Signed)
  Subjective:    Patient ID: Cheyenne Mcintyre, female    DOB: Aug 27, 1966, 45 y.o.   MRN: [id number]  HPI  Patient here for a work in appointment to discuss recent exposure to STD  Patient has been treated for trichomonas earlier this year. She reports repeated sexual intercourse with this same partner without any condom use. She would like further evaluation for trichomonas, gonorrhea, Chlamydia. She declines HIV and syphilis test today states she recently had this done.  Review of Systems denies fever, chills, vaginal discharge, dysuria, dyspareunia, abdominal pain, vaginal bleeding     Objective:   Physical Exam GEN: NAD Pelvic Exam:        External: normal female genitalia without lesions or masses        Vagina: normal without lesions or masses.  Thin white discharge        Cervix: normal without lesions or masses        Adnexa: normal bimanual exam without masses or fullness        Uterus: normal by palpation        Samples for Wet prep, GC/Chlamydia obtained        Assessment & Plan:

## 2011-09-28 NOTE — Patient Instructions (Addendum)
Will call you with results of wet prep today cell [phone]  Will mail results of gonorrhea and chlamydia next week if normal.  Use condoms always!

## 2011-09-29 LAB — GC/CHLAMYDIA PROBE AMP, GENITAL: Chlamydia, DNA Probe: NEGATIVE

## 2011-10-01 ENCOUNTER — Encounter: Payer: Self-pay | Admitting: Family Medicine

## 2012-04-10 ENCOUNTER — Other Ambulatory Visit: Payer: Self-pay | Admitting: Family Medicine

## 2012-05-12 ENCOUNTER — Ambulatory Visit (INDEPENDENT_AMBULATORY_CARE_PROVIDER_SITE_OTHER): Payer: Self-pay | Admitting: Family Medicine

## 2012-05-12 ENCOUNTER — Telehealth: Payer: Self-pay | Admitting: Family Medicine

## 2012-05-12 ENCOUNTER — Encounter: Payer: Self-pay | Admitting: Family Medicine

## 2012-05-12 ENCOUNTER — Other Ambulatory Visit (HOSPITAL_COMMUNITY)
Admission: RE | Admit: 2012-05-12 | Discharge: 2012-05-12 | Disposition: A | Discharge status: Home / Self Care | Payer: Self-pay | Source: Ambulatory Visit | Attending: Family Medicine | Admitting: Family Medicine

## 2012-05-12 VITALS — BP 160/90 | HR 88 | Ht 61.0 in | Wt 224.5 lb

## 2012-05-12 DIAGNOSIS — Z209 Contact with and (suspected) exposure to unspecified communicable disease: Secondary | ICD-10-CM

## 2012-05-12 DIAGNOSIS — B9689 Other specified bacterial agents as the cause of diseases classified elsewhere: Secondary | ICD-10-CM

## 2012-05-12 DIAGNOSIS — N760 Acute vaginitis: Secondary | ICD-10-CM | POA: Insufficient documentation

## 2012-05-12 DIAGNOSIS — Z113 Encounter for screening for infections with a predominantly sexual mode of transmission: Secondary | ICD-10-CM | POA: Insufficient documentation

## 2012-05-12 DIAGNOSIS — Z7251 High risk heterosexual behavior: Secondary | ICD-10-CM

## 2012-05-12 LAB — POCT WET PREP (WET MOUNT)

## 2012-05-12 MED ORDER — METRONIDAZOLE 500 MG PO TABS: 500.0000 mg | ORAL_TABLET | Freq: Two times a day (BID) | ORAL | Status: AC

## 2012-05-12 NOTE — Progress Notes (Signed)
Subjective:     Patient ID: Cheyenne Mcintyre, female   DOB: Nov 26, 1965, 46 y.o.   MRN: [id number]  HPI 46 yo F with an STD history pertinent for trichomonas a few years ago presents for STD check due to unprotected intercourse with her ex-husband on 04/15/12. She denies vaginal discharge, itching, irritation and pelvic pain.   Review of Systems As per HPI  Objective:   Physical Exam BP 160/90  Pulse 88  Ht 5\' 1"  (1.549 m)  Wt 224 lb 8 oz (101.833 kg)  BMI 42.42 kg/m2  LMP 05/12/2012 General appearance: alert, cooperative and no distress Abdomen: soft, non-tender; bowel sounds normal; no masses,  no organomegaly Pelvic: cervix normal in appearance, external genitalia normal, no adnexal masses or tenderness, no cervical motion tenderness, uterus normal size, shape, and consistency and vagina normal with scant white mucoid discharge.  Assessment and Plan:

## 2012-05-12 NOTE — Assessment & Plan Note (Signed)
A: unprotected sex.  P: checked wet prep, GC and chlamydia. Offered HIV, RPR patient declined. She reports having a recent negative HIV test. Counseled patient to use condoms with every sexual encounter. Provided condoms. Will call with results. Patient report ok to leave VM.

## 2012-05-12 NOTE — Telephone Encounter (Signed)
Called patient clue cells on wet prep. Will treat BV with flagyl per orders. Also updated patient's address.

## 2012-05-12 NOTE — Patient Instructions (Addendum)
Cheyenne Mcintyre,  Thank you for coming in today. I will call you with your wet prep and STD check results.  Please use condoms with every sexual encounter.  Dr. Armen Pickup

## 2012-10-09 ENCOUNTER — Other Ambulatory Visit: Payer: Self-pay | Admitting: Family Medicine

## 2013-03-03 ENCOUNTER — Other Ambulatory Visit: Payer: Self-pay | Admitting: Family Medicine

## 2013-09-09 ENCOUNTER — Other Ambulatory Visit: Payer: Self-pay | Admitting: Family Medicine

## 2014-01-12 ENCOUNTER — Other Ambulatory Visit: Payer: Self-pay | Admitting: *Deleted

## 2014-01-13 MED ORDER — HYDROCHLOROTHIAZIDE 25 MG PO TABS: 25.0000 mg | ORAL_TABLET | Freq: Every day | ORAL | Status: DC

## 2014-05-03 ENCOUNTER — Other Ambulatory Visit: Payer: Self-pay | Admitting: *Deleted

## 2014-05-03 MED ORDER — HYDROCHLOROTHIAZIDE 25 MG PO TABS: 25.0000 mg | ORAL_TABLET | Freq: Every day | ORAL | Status: DC

## 2014-05-03 NOTE — Telephone Encounter (Signed)
Refilled HCTZ. Hasn't been seen here in quite a while. Will ask team to call and request f/u.   Murtis SinkSam Jazmina Muhlenkamp, MD [facility] Resident, PGY-3 05/03/2014, 5:39 PM

## 2014-05-04 NOTE — Telephone Encounter (Signed)
Called and informed patient that she will need an office visit for further refills. She says she has already scheduled the appointment.Cheyenne Mcintyre, [name]

## 2014-05-12 ENCOUNTER — Ambulatory Visit: Payer: Self-pay | Admitting: Family Medicine

## 2014-05-13 ENCOUNTER — Ambulatory Visit: Payer: Self-pay | Admitting: Family Medicine

## 2014-05-26 ENCOUNTER — Ambulatory Visit (INDEPENDENT_AMBULATORY_CARE_PROVIDER_SITE_OTHER): Payer: BC Managed Care – PPO | Admitting: Family Medicine

## 2014-05-26 ENCOUNTER — Encounter: Payer: Self-pay | Admitting: Family Medicine

## 2014-05-26 ENCOUNTER — Other Ambulatory Visit (HOSPITAL_COMMUNITY)
Admission: RE | Admit: 2014-05-26 | Discharge: 2014-05-26 | Disposition: A | Discharge status: Home / Self Care | Payer: BC Managed Care – PPO | Source: Ambulatory Visit | Attending: Family Medicine | Admitting: Family Medicine

## 2014-05-26 VITALS — BP 136/85 | HR 84 | Temp 98.1°F | Ht 61.0 in | Wt 206.2 lb

## 2014-05-26 DIAGNOSIS — Z13220 Encounter for screening for lipoid disorders: Secondary | ICD-10-CM | POA: Diagnosis not present

## 2014-05-26 DIAGNOSIS — Z113 Encounter for screening for infections with a predominantly sexual mode of transmission: Secondary | ICD-10-CM | POA: Diagnosis not present

## 2014-05-26 DIAGNOSIS — Z124 Encounter for screening for malignant neoplasm of cervix: Secondary | ICD-10-CM | POA: Diagnosis not present

## 2014-05-26 DIAGNOSIS — Z01419 Encounter for gynecological examination (general) (routine) without abnormal findings: Secondary | ICD-10-CM | POA: Insufficient documentation

## 2014-05-26 DIAGNOSIS — I10 Essential (primary) hypertension: Secondary | ICD-10-CM

## 2014-05-26 DIAGNOSIS — N760 Acute vaginitis: Secondary | ICD-10-CM | POA: Insufficient documentation

## 2014-05-26 DIAGNOSIS — Z1151 Encounter for screening for human papillomavirus (HPV): Secondary | ICD-10-CM | POA: Insufficient documentation

## 2014-05-26 LAB — COMPREHENSIVE METABOLIC PANEL
ALK PHOS: 58 U/L (ref 39–117)
ALT: 8 U/L (ref 0–35)
AST: 8 U/L (ref 0–37)
Albumin: 3.8 g/dL (ref 3.5–5.2)
BUN: 7 mg/dL (ref 6–23)
CO2: 26 mEq/L (ref 19–32)
CREATININE: 0.63 mg/dL (ref 0.50–1.10)
Calcium: 8.8 mg/dL (ref 8.4–10.5)
Chloride: 99 mEq/L (ref 96–112)
Glucose, Bld: 285 mg/dL — ABNORMAL HIGH (ref 70–99)
Potassium: 4.3 mEq/L (ref 3.5–5.3)
Sodium: 134 mEq/L — ABNORMAL LOW (ref 135–145)
Total Bilirubin: 0.9 mg/dL (ref 0.2–1.2)
Total Protein: 6.2 g/dL (ref 6.0–8.3)

## 2014-05-26 LAB — LIPID PANEL
CHOL/HDL RATIO: 4 ratio
Cholesterol: 182 mg/dL (ref 0–200)
HDL: 46 mg/dL (ref >39)
LDL CALC: 106 mg/dL — AB (ref 0–99)
Triglycerides: 151 mg/dL — ABNORMAL HIGH (ref <150)
VLDL: 30 mg/dL (ref 0–40)

## 2014-05-26 NOTE — Progress Notes (Signed)
Patient ID: Cheyenne BellowsRobin L Mcintyre, female   DOB: 02/05/1966, 48 y.o.   MRN: [id number]  HPI:  Patient presents today for a well woman exam.   Concerns today: none Periods: gets periods monthly, most recent lasted 3 days, usually last 5 days Contraception: hx tubal ligation, also uses condoms every time Pelvic symptoms: no vaginal discharge or pelvic pain Sexual activity: one female partner in the last year STD Screening: would like today Pap smear status: due today, no hx of abnormals Exercise: works out 2 hours each day, trying to lose weight, has lost 25 lbs. Diet: eats healthy  ROS: See HPI  PMFSH:  Cancers in family: no immediate family with hx of cancer  PHYSICAL EXAM: BP 136/85  Pulse 84  Temp(Src) 98.1 F (36.7 C) (Oral)  Ht 5\' 1"  (1.549 m)  Wt 206 lb 3.2 oz (93.532 kg)  BMI 38.98 kg/m2  LMP 05/12/2014  Gen: NAD, pleasant, cooperative HEENT: NCAT, PERRL, no palpable thyromegaly or anterior cervical lymphadenopathy Heart: RRR, no murmurs Lungs: CTAB, NWOB Abdomen: soft, nontender to palpation Neuro: grossly nonfocal, speech normal GU: normal appearing external genitalia without lesions. Vagina is moist with white discharge. Cervix normal in appearance (note cervix VERY posterior with copious vaginal side wall bilaterally, making pap difficult). No cervical motion tenderness or tenderness on bimanual exam. No adnexal masses.   ASSESSMENT/PLAN:  Health maintenance: -STD screening: will check gc/chlamydia/trichomonas, HIV, RPR, acute hepatitis panel today  -pap smear: done today - if insufficient sample may require repeat attempt with vaginal side wall retractor -mammogram: given handout on how to schedule -lipid screening: will do this today (fasting today) -handout given on health maintenance topics  HYPERTENSION, BENIGN ESSENTIAL Well controlled. Continue HCTZ. Check renal fxn/electrolytes today. F/u in 3 months with PCP.   FOLLOW UP: F/u in 3 months for HTN with  PCP.  GrenadaBrittany J. Pollie MeyerMcIntyre, MD [facility]

## 2014-05-26 NOTE — Patient Instructions (Addendum)
See the handout on how to schedule your mammogram. This is an important test to screen for breast cancer. I will call you if your test results are not normal.  Otherwise, I will send you a letter.  If you do not hear from me with in 2 weeks please call our office.       Health Maintenance Adopting a healthy lifestyle and getting preventive care can go a long way to promote health and wellness. Talk with your health care provider about what schedule of regular examinations is right for you. This is a good chance for you to check in with your provider about disease prevention and staying healthy. In between checkups, there are plenty of things you can do on your own. Experts have done a lot of research about which lifestyle changes and preventive measures are most likely to keep you healthy. Ask your health care provider for more information. WEIGHT AND DIET  Eat a healthy diet  Be sure to include plenty of vegetables, fruits, low-fat dairy products, and lean protein.  Do not eat a lot of foods high in solid fats, added sugars, or salt.  Get regular exercise. This is one of the most important things you can do for your health.  Most adults should exercise for at least 150 minutes each week. The exercise should increase your heart rate and make you sweat (moderate-intensity exercise).  Most adults should also do strengthening exercises at least twice a week. This is in addition to the moderate-intensity exercise.  Maintain a healthy weight  Body mass index (BMI) is a measurement that can be used to identify possible weight problems. It estimates body fat based on height and weight. Your health care provider can help determine your BMI and help you achieve or maintain a healthy weight.  For females 66 years of age and older:   A BMI below 18.5 is considered underweight.  A BMI of 18.5 to 24.9 is normal.  A BMI of 25 to 29.9 is considered overweight.  A BMI of 30 and above is considered  obese.  Watch levels of cholesterol and blood lipids  You should start having your blood tested for lipids and cholesterol at 47 years of age, then have this test every 5 years.  You may need to have your cholesterol levels checked more often if:  Your lipid or cholesterol levels are high.  You are older than 47 years of age.  You are at high risk for heart disease.  CANCER SCREENING   Lung Cancer  Lung cancer screening is recommended for adults 22-37 years old who are at high risk for lung cancer because of a history of smoking.  A yearly low-dose CT scan of the lungs is recommended for people who:  Currently smoke.  Have quit within the past 15 years.  Have at least a 30-pack-year history of smoking. A pack year is smoking an average of one pack of cigarettes a day for 1 year.  Yearly screening should continue until it has been 15 years since you quit.  Yearly screening should stop if you develop a health problem that would prevent you from having lung cancer treatment.  Breast Cancer  Practice breast self-awareness. This means understanding how your breasts normally appear and feel.  It also means doing regular breast self-exams. Let your health care provider know about any changes, no matter how small.  If you are in your 20s or 30s, you should have a clinical breast exam (  CBE) by a health care provider every 1-3 years as part of a regular health exam.  If you are 40 or older, have a CBE every year. Also consider having a breast X-ray (mammogram) every year.  If you have a family history of breast cancer, talk to your health care provider about genetic screening.  If you are at high risk for breast cancer, talk to your health care provider about having an MRI and a mammogram every year.  Breast cancer gene (BRCA) assessment is recommended for women who have family members with BRCA-related cancers. BRCA-related cancers  include:  Breast.  Ovarian.  Tubal.  Peritoneal cancers.  Results of the assessment will determine the need for genetic counseling and BRCA1 and BRCA2 testing. Cervical Cancer Routine pelvic examinations to screen for cervical cancer are no longer recommended for nonpregnant women who are considered low risk for cancer of the pelvic organs (ovaries, uterus, and vagina) and who do not have symptoms. A pelvic examination may be necessary if you have symptoms including those associated with pelvic infections. Ask your health care provider if a screening pelvic exam is right for you.   The Pap test is the screening test for cervical cancer for women who are considered at risk.  If you had a hysterectomy for a problem that was not cancer or a condition that could lead to cancer, then you no longer need Pap tests.  If you are older than 65 years, and you have had normal Pap tests for the past 10 years, you no longer need to have Pap tests.  If you have had past treatment for cervical cancer or a condition that could lead to cancer, you need Pap tests and screening for cancer for at least 20 years after your treatment.  If you no longer get a Pap test, assess your risk factors if they change (such as having a new sexual partner). This can affect whether you should start being screened again.  Some women have medical problems that increase their chance of getting cervical cancer. If this is the case for you, your health care provider may recommend more frequent screening and Pap tests.  The human papillomavirus (HPV) test is another test that may be used for cervical cancer screening. The HPV test looks for the virus that can cause cell changes in the cervix. The cells collected during the Pap test can be tested for HPV.  The HPV test can be used to screen women 30 years of age and older. Getting tested for HPV can extend the interval between normal Pap tests from three to five years.  An HPV  test also should be used to screen women of any age who have unclear Pap test results.  After 47 years of age, women should have HPV testing as often as Pap tests.  Colorectal Cancer  This type of cancer can be detected and often prevented.  Routine colorectal cancer screening usually begins at 47 years of age and continues through 47 years of age.  Your health care provider may recommend screening at an earlier age if you have risk factors for colon cancer.  Your health care provider may also recommend using home test kits to check for hidden blood in the stool.  A small camera at the end of a tube can be used to examine your colon directly (sigmoidoscopy or colonoscopy). This is done to check for the earliest forms of colorectal cancer.  Routine screening usually begins at age 50.    Direct examination of the colon should be repeated every 5-10 years through 47 years of age. However, you may need to be screened more often if early forms of precancerous polyps or small growths are found. Skin Cancer  Check your skin from head to toe regularly.  Tell your health care provider about any new moles or changes in moles, especially if there is a change in a mole's shape or color.  Also tell your health care provider if you have a mole that is larger than the size of a pencil eraser.  Always use sunscreen. Apply sunscreen liberally and repeatedly throughout the day.  Protect yourself by wearing long sleeves, pants, a wide-brimmed hat, and sunglasses whenever you are outside. HEART DISEASE, DIABETES, AND HIGH BLOOD PRESSURE   Have your blood pressure checked at least every 1-2 years. High blood pressure causes heart disease and increases the risk of stroke.  If you are between 83 years and 38 years old, ask your health care provider if you should take aspirin to prevent strokes.  Have regular diabetes screenings. This involves taking a blood sample to check your fasting blood sugar  level.  If you are at a normal weight and have a low risk for diabetes, have this test once every three years after 47 years of age.  If you are overweight and have a high risk for diabetes, consider being tested at a younger age or more often. PREVENTING INFECTION  Hepatitis B  If you have a higher risk for hepatitis B, you should be screened for this virus. You are considered at high risk for hepatitis B if:  You were born in a country where hepatitis B is common. Ask your health care provider which countries are considered high risk.  Your parents were born in a high-risk country, and you have not been immunized against hepatitis B (hepatitis B vaccine).  You have HIV or AIDS.  You use needles to inject street drugs.  You live with someone who has hepatitis B.  You have had sex with someone who has hepatitis B.  You get hemodialysis treatment.  You take certain medicines for conditions, including cancer, organ transplantation, and autoimmune conditions. Hepatitis C  Blood testing is recommended for:  Everyone born from [birth year] through 1964.  Anyone with known risk factors for hepatitis C. Sexually transmitted infections (STIs)  You should be screened for sexually transmitted infections (STIs) including gonorrhea and chlamydia if:  You are sexually active and are younger than 47 years of age.  You are older than 47 years of age and your health care provider tells you that you are at risk for this type of infection.  Your sexual activity has changed since you were last screened and you are at an increased risk for chlamydia or gonorrhea. Ask your health care provider if you are at risk.  If you do not have HIV, but are at risk, it may be recommended that you take a prescription medicine daily to prevent HIV infection. This is called pre-exposure prophylaxis (PrEP). You are considered at risk if:  You are sexually active and do not regularly use condoms or know the HIV status  of your partner(s).  You take drugs by injection.  You are sexually active with a partner who has HIV. Talk with your health care provider about whether you are at high risk of being infected with HIV. If you choose to begin PrEP, you should first be tested for HIV. You should then be  tested every 3 months for as long as you are taking PrEP.  PREGNANCY   If you are premenopausal and you may become pregnant, ask your health care provider about preconception counseling.  If you may become pregnant, take 400 to 800 micrograms (mcg) of folic acid every day.  If you want to prevent pregnancy, talk to your health care provider about birth control (contraception). OSTEOPOROSIS AND MENOPAUSE   Osteoporosis is a disease in which the bones lose minerals and strength with aging. This can result in serious bone fractures. Your risk for osteoporosis can be identified using a bone density scan.  If you are 65 years of age or older, or if you are at risk for osteoporosis and fractures, ask your health care provider if you should be screened.  Ask your health care provider whether you should take a calcium or vitamin D supplement to lower your risk for osteoporosis.  Menopause may have certain physical symptoms and risks.  Hormone replacement therapy may reduce some of these symptoms and risks. Talk to your health care provider about whether hormone replacement therapy is right for you.  HOME CARE INSTRUCTIONS   Schedule regular health, dental, and eye exams.  Stay current with your immunizations.   Do not use any tobacco products including cigarettes, chewing tobacco, or electronic cigarettes.  If you are pregnant, do not drink alcohol.  If you are breastfeeding, limit how much and how often you drink alcohol.  Limit alcohol intake to no more than 1 drink per day for nonpregnant women. One drink equals 12 ounces of beer, 5 ounces of wine, or 1 ounces of hard liquor.  Do not use street  drugs.  Do not share needles.  Ask your health care provider for help if you need support or information about quitting drugs.  Tell your health care provider if you often feel depressed.  Tell your health care provider if you have ever been abused or do not feel safe at home. Document Released: 04/30/2011 Document Revised: 03/01/2014 Document Reviewed: 09/16/2013 [employer] Patient Information 2015 [employer]. This information is not intended to replace advice given to you by your health care provider. Make sure you discuss any questions you have with your health care provider.  

## 2014-05-26 NOTE — Assessment & Plan Note (Signed)
Well controlled. Continue HCTZ. Check renal fxn/electrolytes today. F/u in 3 months with PCP.

## 2014-05-27 ENCOUNTER — Encounter: Payer: Self-pay | Admitting: Family Medicine

## 2014-05-27 LAB — RPR

## 2014-05-27 LAB — HEPATITIS PANEL, ACUTE
HCV Ab: NEGATIVE
HEP B C IGM: NONREACTIVE
Hep A IgM: NONREACTIVE
Hepatitis B Surface Ag: NEGATIVE

## 2014-05-27 LAB — HIV ANTIBODY (ROUTINE TESTING W REFLEX): HIV 1&2 Ab, 4th Generation: NONREACTIVE

## 2014-05-28 ENCOUNTER — Telehealth: Payer: Self-pay | Admitting: *Deleted

## 2014-05-28 ENCOUNTER — Encounter: Payer: Self-pay | Admitting: Family Medicine

## 2014-05-28 LAB — CYTOLOGY - PAP

## 2014-05-28 NOTE — Telephone Encounter (Signed)
Spoke with patient and informed her of below results 

## 2014-05-28 NOTE — Telephone Encounter (Signed)
Message copied by Tanna SavoyPROPOSITO, Mingo Siegert S on Fri May 28, 2014  4:34 PM ------      Message from: Tommie SamsOOK, JAYCE G      Created: Fri May 28, 2014 12:26 PM       Please inform of negative pap. ------

## 2014-05-28 NOTE — Telephone Encounter (Signed)
Message copied by Farrell OursEVANS, Suda Forbess K on Fri May 28, 2014  9:25 AM ------      Message from: Tommie SamsOOK, JAYCE G      Created: Thu May 27, 2014  9:21 PM       Please inform patient of negative GC/Chlamydia. ------

## 2014-05-28 NOTE — Telephone Encounter (Signed)
Patient informed.voiced understanding.Cheyenne Mcintyre S  

## 2014-09-15 ENCOUNTER — Other Ambulatory Visit: Payer: Self-pay | Admitting: *Deleted

## 2014-09-16 MED ORDER — HYDROCHLOROTHIAZIDE 25 MG PO TABS: 25.0000 mg | ORAL_TABLET | Freq: Every day | ORAL | Status: DC

## 2014-10-26 ENCOUNTER — Ambulatory Visit (INDEPENDENT_AMBULATORY_CARE_PROVIDER_SITE_OTHER): Payer: BC Managed Care – PPO | Admitting: Family Medicine

## 2014-10-26 ENCOUNTER — Encounter: Payer: Self-pay | Admitting: Family Medicine

## 2014-10-26 VITALS — BP 153/104 | HR 79 | Temp 99.0°F | Ht 61.0 in | Wt 219.7 lb

## 2014-10-26 DIAGNOSIS — N951 Menopausal and female climacteric states: Secondary | ICD-10-CM | POA: Diagnosis not present

## 2014-10-26 NOTE — Assessment & Plan Note (Signed)
Menses spacing out and irregular, hot flashes, vaginal dryness, mood disturbance, none bothersome enough to want treatment - discussed transition and answered questions - reassurance given

## 2014-10-26 NOTE — Patient Instructions (Signed)
Perimenopause Perimenopause is the time when your body begins to move into the menopause (no menstrual period for 12 straight months). It is a natural process. Perimenopause can begin 2-8 years before the menopause and usually lasts for 1 year after the menopause. During this time, your ovaries may or may not produce an egg. The ovaries vary in their production of estrogen and progesterone hormones each month. This can cause irregular menstrual periods, difficulty getting pregnant, vaginal bleeding between periods, and uncomfortable symptoms. SIGNS AND SYMPTOMS   Hot flashes.  Night sweats.  Irregular menstrual periods.  Decreased sex drive.  Vaginal dryness.  Headaches.  Mood swings.  Depression.  Memory problems.  Irritability.  Tiredness.  Weight gain.  Trouble getting pregnant.  The beginning of losing bone cells (osteoporosis).  The beginning of hardening of the arteries (atherosclerosis). DIAGNOSIS  Your health care provider will make a diagnosis by analyzing your age, menstrual history, and symptoms. He or she will do a physical exam and note any changes in your body, especially your female organs. Female hormone tests may or may not be helpful depending on the amount of female hormones you produce and when you produce them. However, other hormone tests may be helpful to rule out other problems. TREATMENT  In some cases, no treatment is needed. The decision on whether treatment is necessary during the perimenopause should be made by you and your health care provider based on how the symptoms are affecting you and your lifestyle. Various treatments are available, such as:  Treating individual symptoms with a specific medicine for that symptom.  Herbal medicines that can help specific symptoms.  Counseling.  Group therapy. HOME CARE INSTRUCTIONS   Keep track of your menstrual periods (when they occur, how heavy they are, how long between periods, and how long they  last) as well as your symptoms and when they started.  Only take over-the-counter or prescription medicines as directed by your health care provider.  Sleep and rest.  Exercise.  Eat a diet that contains calcium (good for your bones) and soy (acts like the estrogen hormone).  Do not smoke.  Avoid alcoholic beverages.  Take vitamin supplements as recommended by your health care provider. Taking vitamin E may help in certain cases.  Take calcium and vitamin D supplements to help prevent bone loss.  Group therapy is sometimes helpful.  Acupuncture may help in some cases. SEEK MEDICAL CARE IF:   You have questions about any symptoms you are having.  You need a referral to a specialist (gynecologist, psychiatrist, or psychologist). SEEK IMMEDIATE MEDICAL CARE IF:   You have vaginal bleeding.  Your period lasts longer than 8 days.  Your periods are recurring sooner than 21 days.  You have bleeding after intercourse.  You have severe depression.  You have pain when you urinate.  You have severe headaches.  You have vision problems. Document Released: 11/22/2004 Document Revised: 08/05/2013 Document Reviewed: 05/14/2013 [facility] Patient Information 2015 [place], [place]. This information is not intended to replace advice given to you by your health care provider. Make sure you discuss any questions you have with your health care provider.

## 2014-10-26 NOTE — Progress Notes (Signed)
   Subjective:    Patient ID: Bynum BellowsRobin L Exantus, female    DOB: 1966-08-14, 48 y.o.   MRN: [id number]  HPI Pt presents for concern that she may be going through menopause. She reports that in October she had her period for 2 days, then it stopped, then it came back for a few days. She has not had a period since then. She has also noted frequent hot flashes, usually at night and has been having vaginal dryness and mood swings. She reports that she is using lubrication which works well. None are bothersome enough for her to want treatment.   Review of Systems See HPI    Objective:   Physical Exam  Constitutional: She is oriented to person, place, and time. She appears well-developed and well-nourished. No distress.  HENT:  Head: Normocephalic and atraumatic.  Eyes: Conjunctivae are normal. Right eye exhibits no discharge. Left eye exhibits no discharge. No scleral icterus.  Cardiovascular: Normal rate.   Pulmonary/Chest: Effort normal.  Abdominal: She exhibits no distension.  Neurological: She is alert and oriented to person, place, and time.  Skin: Skin is warm and dry. No rash noted. She is not diaphoretic.  Psychiatric: She has a normal mood and affect. Her behavior is normal.  Nursing note and vitals reviewed.         Assessment & Plan:

## 2015-02-02 ENCOUNTER — Ambulatory Visit: Payer: Self-pay | Admitting: Family Medicine

## 2015-02-04 ENCOUNTER — Encounter: Payer: Self-pay | Admitting: Family Medicine

## 2015-02-04 ENCOUNTER — Ambulatory Visit (INDEPENDENT_AMBULATORY_CARE_PROVIDER_SITE_OTHER): Payer: BC Managed Care – PPO | Admitting: Family Medicine

## 2015-02-04 VITALS — BP 115/59 | HR 116 | Temp 98.3°F | Ht 65.0 in | Wt 213.0 lb

## 2015-02-04 DIAGNOSIS — R739 Hyperglycemia, unspecified: Secondary | ICD-10-CM

## 2015-02-04 DIAGNOSIS — E1165 Type 2 diabetes mellitus with hyperglycemia: Secondary | ICD-10-CM | POA: Diagnosis not present

## 2015-02-04 DIAGNOSIS — IMO0002 Reserved for concepts with insufficient information to code with codable children: Secondary | ICD-10-CM

## 2015-02-04 LAB — POCT GLYCOSYLATED HEMOGLOBIN (HGB A1C): Hemoglobin A1C: 12.7

## 2015-02-04 LAB — BASIC METABOLIC PANEL WITH GFR
BUN: 12 mg/dL (ref 6–23)
CHLORIDE: 87 meq/L — AB (ref 96–112)
CO2: 30 meq/L (ref 19–32)
Calcium: 9.3 mg/dL (ref 8.4–10.5)
Creat: 0.78 mg/dL (ref 0.50–1.10)
GFR, Est African American: >89 mL/min
GFR, Est Non African American: >89 mL/min
Glucose, Bld: 412 mg/dL — ABNORMAL HIGH (ref 70–99)
Potassium: 5.5 mEq/L — ABNORMAL HIGH (ref 3.5–5.3)
Sodium: 131 mEq/L — ABNORMAL LOW (ref 135–145)

## 2015-02-04 LAB — CBC
HEMATOCRIT: 41.7 % (ref 36.0–46.0)
Hemoglobin: 14 g/dL (ref 12.0–15.0)
MCH: 29.9 pg (ref 26.0–34.0)
MCHC: 33.6 g/dL (ref 30.0–36.0)
MCV: 88.9 fL (ref 78.0–100.0)
MPV: 10 fL (ref 8.6–12.4)
PLATELETS: 262 10*3/uL (ref 150–400)
RBC: 4.69 MIL/uL (ref 3.87–5.11)
RDW: 13 % (ref 11.5–15.5)
WBC: 4.8 10*3/uL (ref 4.0–10.5)

## 2015-02-04 LAB — GLUCOSE, CAPILLARY: Glucose-Capillary: 407 mg/dL — ABNORMAL HIGH (ref 70–99)

## 2015-02-04 MED ORDER — METFORMIN HCL 500 MG PO TABS: 500.0000 mg | ORAL_TABLET | Freq: Two times a day (BID) | ORAL | Status: DC

## 2015-02-04 NOTE — Patient Instructions (Signed)
Metformin 500 twice a day, start tonight.  Drink plenty of water.  Avoid all sugars and carbs over the weekend.  Diet Recommendations for Diabetes   Starchy (carb) foods include: Bread, rice, pasta, potatoes, corn, crackers, bagels, muffins, all baked goods.  (Fruits, milk, and yogurt also have carbohydrate, but most of these foods will not spike your blood sugar as the starchy foods will.)  A few fruits do cause high blood sugars; use small portions of bananas (limit to 1/2 at a time), grapes, and tropical fruits.    Protein foods include: Meat, fish, poultry, eggs, dairy foods, and beans such as pinto and kidney beans (beans also provide carbohydrate).   1. Eat at least 3 meals and 1-2 snacks per day. Never go more than 4-5 hours while awake without eating.  2. Limit starchy foods to TWO per meal and ONE per snack. ONE portion of a starchy  food is equal to the following:   - ONE slice of bread (or its equivalent, such as half of a hamburger bun).   - 1/2 cup of a "scoopable" starchy food such as potatoes or rice.   - 15 grams of carbohydrate as shown on food label.  3. Both lunch and dinner should include a protein food, a carb food, and vegetables.   - Obtain twice as many veg's as protein or carbohydrate foods for both lunch and dinner.   - Fresh or frozen veg's are best.   - Try to keep frozen veg's on hand for a quick vegetable serving.    4. Breakfast should always include protein.

## 2015-02-04 NOTE — Assessment & Plan Note (Signed)
New diagnosis, A1c 12.7, glucose in office 401. Her current generalized symptoms including dry mouth would be explained by prolonged hyperglycemia. Overall she appears well on exam. Discussed new diagnosis, did brief counseling regarding mechanism of disease, importance to cut out all carbohydrate and sugars over the weekend and start metformin tonight (most recent Cr was normal in July 2015). F/u ASAP next week for further discussion. Also drew Bmet, CBC in visit today.

## 2015-02-04 NOTE — Progress Notes (Signed)
   Subjective:    Patient ID: Cheyenne Mcintyre, female    DOB: Dec 04, 1965, 49 y.o.   MRN: [id number]  HPI  CC: lightheaded  # Lightheaded/blurry vision/sick:  Cough Thursday/Friday. Non-productive  Took coricedin  Friday developed pain in abdomen from coughing  Lightheaded for the last few days, didn't go to work  Vision blurry.   Mouth has been very dry, she went and bought mouth spray to try and keep it moist  Menstrual cycle started 2 days ago, ?perimenopausal, last period before that was 3/25. Some hot flashes.   No pain.   2 cups of orange juice, water yesterday.  ROS: felt some fevers (hot flashes?), no nausea or vomiting. Had a leg cramp but went away.    Review of Systems   See HPI for ROS. All other systems reviewed and are negative.  Past medical history, surgical, family, and social history reviewed and updated in the EMR as appropriate. Objective:  BP 115/59 mmHg  Pulse 116  Temp(Src) 98.3 F (36.8 C) (Oral)  Ht 5\' 5"  (1.651 m)  Wt 213 lb (96.616 kg)  BMI 35.44 kg/m2  LMP 02/01/2015 (Exact Date) Vitals and nursing note reviewed  General: NAD CV: tachycardic, regular rhythm, normal heart sounds, no murmurs. 2+ radial pulses Resp: clear bilaterally. No w/r/c. Cough present once during visit  Assessment & Plan:  See Problem List Documentation

## 2015-02-28 ENCOUNTER — Telehealth: Payer: Self-pay | Admitting: Family Medicine

## 2015-02-28 NOTE — Telephone Encounter (Signed)
Called to follow up with patient. New diagnosis of diabetes. If pt returns call please let her know I was checking to see how she was feeling and to encourage her to schedule a follow up appt with PCP to discuss new diagnosis. -Dr. Waynetta SandyWight

## 2015-06-03 ENCOUNTER — Ambulatory Visit (INDEPENDENT_AMBULATORY_CARE_PROVIDER_SITE_OTHER): Payer: BC Managed Care – PPO | Admitting: Internal Medicine

## 2015-06-03 ENCOUNTER — Encounter: Payer: Self-pay | Admitting: Internal Medicine

## 2015-06-03 VITALS — BP 141/96 | HR 92 | Temp 97.9°F | Ht 65.0 in | Wt 192.0 lb

## 2015-06-03 DIAGNOSIS — E1165 Type 2 diabetes mellitus with hyperglycemia: Secondary | ICD-10-CM | POA: Diagnosis not present

## 2015-06-03 DIAGNOSIS — Z87891 Personal history of nicotine dependence: Secondary | ICD-10-CM | POA: Insufficient documentation

## 2015-06-03 DIAGNOSIS — IMO0002 Reserved for concepts with insufficient information to code with codable children: Secondary | ICD-10-CM

## 2015-06-03 DIAGNOSIS — E785 Hyperlipidemia, unspecified: Secondary | ICD-10-CM | POA: Diagnosis not present

## 2015-06-03 DIAGNOSIS — I10 Essential (primary) hypertension: Secondary | ICD-10-CM

## 2015-06-03 DIAGNOSIS — Z789 Other specified health status: Secondary | ICD-10-CM

## 2015-06-03 LAB — POCT GLYCOSYLATED HEMOGLOBIN (HGB A1C): Hemoglobin A1C: 6.1

## 2015-06-03 NOTE — Assessment & Plan Note (Signed)
Obesity - 191lbs  - Lost 20lbs

## 2015-06-03 NOTE — Patient Instructions (Addendum)
Thank you for coming in today. Congratulations on quitting smoking. I want to see you back in 3 months to follow up with your diabetes and your cholesterol. You are doing a great job with your exercise and diet. Keep up the good work.    Diabetes and Exercise Exercising regularly is important. It is not just about losing weight. It has many health benefits, such as:  Improving your overall fitness, flexibility, and endurance.  Increasing your bone density.  Helping with weight control.  Decreasing your body fat.  Increasing your muscle strength.  Reducing stress and tension.  Improving your overall health. People with diabetes who exercise gain additional benefits because exercise:  Reduces appetite.  Improves the body's use of blood sugar (glucose).  Helps lower or control blood glucose.  Decreases blood pressure.  Helps control blood lipids (such as cholesterol and triglycerides).  Improves the body's use of the hormone insulin by:  Increasing the body's insulin sensitivity.  Reducing the body's insulin needs.  Decreases the risk for heart disease because exercising:  Lowers cholesterol and triglycerides levels.  Increases the levels of good cholesterol (such as high-density lipoproteins [HDL]) in the body.  Lowers blood glucose levels. YOUR ACTIVITY PLAN  Choose an activity that you enjoy and set realistic goals. Your health care provider or diabetes educator can help you make an activity plan that works for you. Exercise regularly as directed by your health care provider. This includes:  Performing resistance training twice a week such as push-ups, sit-ups, lifting weights, or using resistance bands.  Performing 150 minutes of cardio exercises each week such as walking, running, or playing sports.  Staying active and spending no more than 90 minutes at one time being inactive. Even short bursts of exercise are good for you. Three 10-minute sessions spread  throughout the day are just as beneficial as a single 30-minute session. Some exercise ideas include:  Taking the dog for a walk.  Taking the stairs instead of the elevator.  Dancing to your favorite song.  Doing an exercise video.  Doing your favorite exercise with a friend. RECOMMENDATIONS FOR EXERCISING WITH TYPE 1 OR TYPE 2 DIABETES   Check your blood glucose before exercising. If blood glucose levels are greater than 240 mg/dL, check for urine ketones. Do not exercise if ketones are present.  Avoid injecting insulin into areas of the body that are going to be exercised. For example, avoid injecting insulin into:  The arms when playing tennis.  The legs when jogging.  Keep a record of:  Food intake before and after you exercise.  Expected peak times of insulin action.  Blood glucose levels before and after you exercise.  The type and amount of exercise you have done.  Review your records with your health care provider. Your health care provider will help you to develop guidelines for adjusting food intake and insulin amounts before and after exercising.  If you take insulin or oral hypoglycemic agents, watch for signs and symptoms of hypoglycemia. They include:  Dizziness.  Shaking.  Sweating.  Chills.  Confusion.  Drink plenty of water while you exercise to prevent dehydration or heat stroke. Body water is lost during exercise and must be replaced.  Talk to your health care provider before starting an exercise program to make sure it is safe for you. Remember, almost any type of activity is better than none. Document Released: 01/05/2004 Document Revised: 03/01/2014 Document Reviewed: 03/24/2013 [facility] Patient Information 2015 [place], [place]. This information is  not intended to replace advice given to you by your health care provider. Make sure you discuss any questions you have with your health care provider.

## 2015-06-03 NOTE — Assessment & Plan Note (Signed)
Diabetes- HgbA1C 12.7 on 02/14/2015. Received 2 months of 500 mg of metformin -she took all of these pills  - Continue diet and exercise, patient working out  - HgbA1C 6.1 today, patient lost 20 lbs over the past 4 months  - F/u A1C, without metformin in 3 months   - If A1C elevated, consider adding back metformin  - Will get urine microalbumin at next visit   - Diabetic Foot exam negative for neuropathy 5/5 left and right foot  - Eye exam - saw opthamoglist  2 months ago, will need to clarify if patient examined with diabetes diagnosis

## 2015-06-03 NOTE — Assessment & Plan Note (Signed)
HTN. Patient blood pressure 141/96. Patient did not take HCTZ this AM. Previously well controlled.  - Will recheck blood pressure at next visit, goal <140/90 for patients with diabetes - Hyperkalemia K+ = 5.5, F/u BMP -- would expect low K+

## 2015-06-03 NOTE — Progress Notes (Signed)
[facility] [name], MD Phone: [phone]  Subjective:  Patient doing well. She states that when she last came in she was feeling terrible due to her elevated blood sugars. However patient states that she has stopped smoking, began a work out regiment (working out an hour and half daily). She is now only eating whole grain bread. She has cut out pasta and rice. She has stopped wine and beer. She only drinks water these days. She took her metformin for 2 months she was given.   She is continuing to take her HCTZ without issue. However she missed her dose today as she was fasting and didn't realize she could take her pills. Her blood pressure's systolic range in 130s- 120s normally.   All relevant systems were reviewed and were negative unless otherwise noted in the HPI  Past Medical History Reviewed problem list.  Medications- reviewed and updated Current Outpatient Prescriptions  Medication Sig Dispense Refill  . Biotin 1000 MCG tablet Take 1,000 mcg by mouth daily.    . Cyanocobalamin 1500 MCG TBDP Take 1 tablet by mouth daily.    . hydrochlorothiazide (HYDRODIURIL) 25 MG tablet Take 1 tablet (25 mg total) by mouth daily. 30 tablet 11  . GARCINIA CAMBOGIA-CHROMIUM PO Take by mouth daily.    . metFORMIN (GLUCOPHAGE) 500 MG tablet Take 1 tablet (500 mg total) by mouth 2 (two) times daily with a meal. (Patient not taking: Reported on 06/03/2015) 60 tablet 0   No current facility-administered medications for this visit.   Chief complaint-noted No additions to family history Social history- patient is a previous smoker  Objective: BP 141/96 mmHg  Pulse 92  Temp(Src) 97.9 F (36.6 C) (Oral)  Ht  (1.651 m)  Wt 192 lb (87.091 kg)  BMI 31.95 kg/m2 Gen: NAD, alert, cooperative with exam HEENT: NCAT, EOMI, PERRL, Neck: FROM, supple CV: RRR, good S1/S2, no murmur, cap refill <3 Resp: CTABL, no wheezes, non-labored Abd: SNTND, BS present, no guarding  or organomegaly Ext: No edema, warm, normal tone, moves UE/LE spontaneously Neuro: Alert and oriented, No gross deficits Skin: no rashes no lesions   Assessment/Plan: Diabetes- HgbA1C 12.7 on 02/14/2015. Received 2 months of 500 mg of metformin -she took all of these pills  - Continue diet and exercise, patient working out  - HgbA1C 6.1 today, patient lost 20 lbs over the past 4 months  - F/u A1C, without metformin in 3 months   - If A1C elevated, consider adding back metformin  - Will get urine microalbumin at next visit   - Diabetic Foot exam negative for neuropathy 5/5 left and right foot  - Eye exam - saw opthamoglist  2 months ago, will need to clarify if patient examined with diabetes diagnosis    Hyperlipidemia 04/2014 LDL 106. Patient with newly diagnose diabetes  - ASCVD with diabetes recommends a moderate/high intensity statin, without diabetes no recommendation for statin  - F/u lipid panel results and liver panel  - Consider statin therapy   HTN. Patient blood pressure 141/96. Patient did not take HCTZ this AM. Previously well controlled.  - Will recheck blood pressure at next visit, goal <140/90 for patients with diabetes - Hyperkalemia K+ = 5.5, F/u BMP -- would expect low K+  Smoking Cessation - Patient went cold [place] - Patient has quit smoking April 2016  - Congrats Again!  - F/U how is that going?   GYN  - HPV negative, normal pap in 2015,  -  F/U pap smear in 2020  Obesity - 191lbs  - Lost 20lbs

## 2015-06-04 LAB — COMPLETE METABOLIC PANEL WITH GFR
ALBUMIN: 4.3 g/dL (ref 3.6–5.1)
ALT: 13 U/L (ref 6–29)
AST: 12 U/L (ref 10–35)
Alkaline Phosphatase: 61 U/L (ref 33–115)
BILIRUBIN TOTAL: 1 mg/dL (ref 0.2–1.2)
BUN: 11 mg/dL (ref 7–25)
CO2: 25 mmol/L (ref 20–31)
CREATININE: 0.61 mg/dL (ref 0.50–1.10)
Calcium: 9.4 mg/dL (ref 8.6–10.2)
Chloride: 99 mmol/L (ref 98–110)
GFR, Est African American: >89 mL/min (ref >=60)
GFR, Est Non African American: >89 mL/min (ref >=60)
Glucose, Bld: 104 mg/dL — ABNORMAL HIGH (ref 65–99)
Potassium: 3.8 mmol/L (ref 3.5–5.3)
Sodium: 138 mmol/L (ref 135–146)
TOTAL PROTEIN: 6.7 g/dL (ref 6.1–8.1)

## 2015-06-04 LAB — LIPID PANEL
CHOL/HDL RATIO: 3.8 ratio (ref <=5.0)
CHOLESTEROL: 196 mg/dL (ref 125–200)
HDL: 52 mg/dL (ref >=46)
LDL Cholesterol: 123 mg/dL (ref <130)
TRIGLYCERIDES: 103 mg/dL (ref <150)
VLDL: 21 mg/dL (ref <30)

## 2015-06-07 ENCOUNTER — Encounter: Payer: Self-pay | Admitting: Internal Medicine

## 2015-06-08 ENCOUNTER — Encounter: Payer: BC Managed Care – PPO | Admitting: Internal Medicine

## 2015-09-13 ENCOUNTER — Other Ambulatory Visit: Payer: Self-pay | Admitting: Family Medicine

## 2015-10-14 ENCOUNTER — Other Ambulatory Visit: Payer: Self-pay | Admitting: *Deleted

## 2015-10-14 MED ORDER — HYDROCHLOROTHIAZIDE 25 MG PO TABS: 25.0000 mg | ORAL_TABLET | Freq: Every day | ORAL | Status: DC

## 2015-12-07 ENCOUNTER — Telehealth: Payer: Self-pay | Admitting: Internal Medicine

## 2015-12-07 NOTE — Telephone Encounter (Signed)
Pt called and would like a colon cleanse. She would like [place] to do a referral if this is something that her insurance would cover. jw

## 2015-12-07 NOTE — Telephone Encounter (Signed)
Spoke with patient, she states she was looking for a recommendation not a referral. Informed patient that we do not routinely refer to these types of places and as such cannot offer any reccomendations. Patient states she will check online.

## 2016-05-22 ENCOUNTER — Encounter: Payer: BC Managed Care – PPO | Admitting: Internal Medicine

## 2016-06-04 ENCOUNTER — Encounter: Payer: BC Managed Care – PPO | Admitting: Internal Medicine

## 2016-06-12 ENCOUNTER — Ambulatory Visit (INDEPENDENT_AMBULATORY_CARE_PROVIDER_SITE_OTHER): Payer: BC Managed Care – PPO | Admitting: Internal Medicine

## 2016-06-12 ENCOUNTER — Encounter: Payer: Self-pay | Admitting: Internal Medicine

## 2016-06-12 VITALS — BP 145/87 | HR 87 | Temp 98.6°F | Ht 65.0 in | Wt 209.0 lb

## 2016-06-12 DIAGNOSIS — E1165 Type 2 diabetes mellitus with hyperglycemia: Secondary | ICD-10-CM | POA: Diagnosis not present

## 2016-06-12 DIAGNOSIS — IMO0001 Reserved for inherently not codable concepts without codable children: Secondary | ICD-10-CM

## 2016-06-12 DIAGNOSIS — I10 Essential (primary) hypertension: Secondary | ICD-10-CM | POA: Diagnosis not present

## 2016-06-12 LAB — BASIC METABOLIC PANEL WITH GFR
BUN: 7 mg/dL (ref 7–25)
CALCIUM: 9.1 mg/dL (ref 8.6–10.2)
CO2: 29 mmol/L (ref 20–31)
Chloride: 93 mmol/L — ABNORMAL LOW (ref 98–110)
Creat: 0.67 mg/dL (ref 0.50–1.10)
GFR, Est African American: >89 mL/min (ref >=60)
Glucose, Bld: 397 mg/dL — ABNORMAL HIGH (ref 65–99)
Potassium: 3.9 mmol/L (ref 3.5–5.3)
Sodium: 131 mmol/L — ABNORMAL LOW (ref 135–146)

## 2016-06-12 LAB — LIPID PANEL
Cholesterol: 188 mg/dL (ref 125–200)
HDL: 42 mg/dL — AB (ref >=46)
LDL CALC: 102 mg/dL (ref <130)
Total CHOL/HDL Ratio: 4.5 Ratio (ref <=5.0)
Triglycerides: 221 mg/dL — ABNORMAL HIGH (ref <150)
VLDL: 44 mg/dL — ABNORMAL HIGH (ref <30)

## 2016-06-12 LAB — POCT GLYCOSYLATED HEMOGLOBIN (HGB A1C): Hemoglobin A1C: 13

## 2016-06-12 MED ORDER — METFORMIN HCL 500 MG PO TABS: 1000.0000 mg | ORAL_TABLET | Freq: Two times a day (BID) | ORAL | 6 refills | Status: DC

## 2016-06-12 NOTE — Progress Notes (Signed)
Cheyenne BellowsRobin L Kataoka is a 50 y.o. female presents to office today for annual physical exam examination.  Concerns today include:  1. CHRONIC DM, Type 2: Reports no concerns. Patient states over the past 2 and half months she has been eating whatever she wants and not exercising because she has been on vacation. Plans to get back on her diet  CBGs Not checked  Taking Metformin twice daily, 1000 mg - stopped for about 3 months, restarted last year thanksgiving  Reports good compliance. Tolerating well w/o side-effects Currently on ACEi / ARB  Lifestyle: Diet  / Exercise  Any hypoglycemia episodes:  Denies polyuria, visual changes, numbness or tingling.  2.  CHRONIC HTN: Reports no issues  Current Meds - HCTZ  Reports good compliance, did not take medications today. Tolerating well, w/o complaints. Lifestyle - Exercise and diet  Denies CP, dyspnea, HA, edema, dizziness / lightheadedness  Last eye exam:  Due for an eye exam, planning to get one   Last dental exam: Not been to dentist in several years.  Last colonoscopy: Screening beginning next year   Last mammogram: Not this year, make an appointment for mammogram  Last pap smear: Every 5 years (2019 next time due) Immunizations needed: TDAP and Pneumoccal vaccine needed - patient would like information on this before decision  Refills needed today: None  Women's Health  Periods: Menstrual periods stopped,  August 2016  Contraception: None  Sexual activity: Yes, 1 partner  STD Screening: none  Exercise: 5 days weeks, 1 hour  Diet: Breakfast: Boiled egg and apple sauce, Lunch: [place] and Kale and spinach salad Dinner: Baked Chicken and brussels sprouts - Typical for patient  Smoking: Previous smoker  Alcohol: 1 glass of wine on the weekend Drugs: None  Advance directives: Information Given  Mood: None     Past Medical History:  Diagnosis Date  . 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 Marital status: Single    Spouse name: N/A  . Number of children: N/A  . Years of education: N/A   Occupational History  . Not on file.   Social History Main Topics  . Smoking status: Current Every Day Smoker    Packs/day: 0.50    Types: Cigarettes  . Smokeless tobacco: Never Used  . Alcohol use Not on file  . Drug use: Unknown  . Sexual activity: Not on file   Other Topics Concern  . Not on file   Social History Narrative  . No narrative on file   No past surgical history on file. No family history on file.  ROS: Review of Systems Review of Systems  Eyes: Negative for blurred vision.  Respiratory: Negative for shortness of breath.   Cardiovascular: Negative for chest pain, palpitations and leg swelling.  Neurological: Negative for dizziness and headaches.  Psychiatric/Behavioral: Negative for depression. The patient is not nervous/anxious.    Vitals:   06/12/16 1053  BP: (!) 145/87  Pulse: 87  Temp: 98.6 F (37 C)    Physical exam Physical Exam  Constitutional: She is oriented to person, place, and time. She appears well-developed and well-nourished.  HENT:  Head: Normocephalic.  Right Ear: External ear normal.  Left Ear: External ear normal.  Mouth/Throat: Oropharynx is clear and moist.  Eyes: Conjunctivae are normal. Pupils are equal, round, and reactive to light.  Neck: Normal range of motion. Neck supple.  No thyromegaly present.  Cardiovascular: Normal rate, regular rhythm, normal heart sounds and intact distal pulses.   No murmur heard. Pulmonary/Chest: Effort normal and breath sounds normal.  Abdominal: Soft. Bowel sounds are normal.  Musculoskeletal: Normal range of motion. She exhibits no edema.  Lymphadenopathy:    She has no cervical adenopathy.  Neurological: She is alert and oriented to person, place, and time. She has normal reflexes.  Skin: Skin is warm. No rash noted.  Psychiatric: She has a normal  mood and affect. Her behavior is normal. Thought content normal.     Diabetic Foot Exam - Simple   Simple Foot Form Diabetic Foot exam was performed with the following findings:  Yes 06/12/2016 11:30 AM  Visual Inspection Sensation Testing Intact to touch and monofilament testing bilaterally:  Yes Pulse Check Posterior Tibialis and Dorsalis pulse intact bilaterally:  Yes Comments     Assessment/ Plan: Patient with T2DM and HTN here for annual physical exam.   Type 2 diabetes mellitus, uncontrolled A1c 13 today, previously 6.1. Patient states that she has been not following her diet over the past 2 and half months. She plans to get back on this diet to bring down her blood sugar -Diabetic foot exam performed today normal -Microalbumin obtained -Discussed the addition of an ACE inhibitor, patient reticent to start any other medication at this point -Metformin increased from 1000 mg to 2000 mg daily - ASCVD risk 13.1%, consider adding a statin and 81 mg of aspirin at next visit -Recommended pneumococcal vaccine, patient declined for now will reconsider in the future after reviewing online - Will make an appointment with ophthalmology for eye exam - Plans to follow up in 2 months after diet changes.    HYPERTENSION, BENIGN ESSENTIAL Blood pressure is noted to be borderline elevated today, but patient did not take medication today. Reminder her to take medication before visit. She states she checks blood pressure regularly and it is always below 140/80 - Continue HCTZ for now, follow up in 2 months     Illana Nolting Cathlean CowerMikell PGY-2, [facility]

## 2016-06-12 NOTE — Assessment & Plan Note (Addendum)
A1c 13 today, previously 6.1. Patient states that she has been not following her diet over the past 2 and half months. She plans to get back on this diet to bring down her blood sugar -Diabetic foot exam performed today normal -Microalbumin obtained -Discussed the addition of an ACE inhibitor, patient reticent to start any other medication at this point -Metformin increased from 1000 mg to 2000 mg daily - ASCVD risk 13.1%, consider adding a statin and 81 mg of aspirin at next visit, discuss again at that point -Recommended pneumococcal vaccine, patient declined for now will reconsider in the future after reviewing online - Will make an appointment with ophthalmology for eye exam - Plans to follow up in 2 months after diet changes.

## 2016-06-12 NOTE — Assessment & Plan Note (Signed)
Blood pressure is noted to be borderline elevated today, but patient did not take medication today. Reminder her to take medication before visit. She states she checks blood pressure regularly and it is always below 140/80 - Continue HCTZ for now, follow up in 2 months

## 2016-06-12 NOTE — Patient Instructions (Signed)
Diabetes Your diabetes is not controlled, currently A1C 13.0  Your goal is to have an A1c < 8.0  Medicine Changes: Metformin 2 pills twice daily  Come back to see us in:  2 months   High Blood Pressure Your BP is controlled Your goal is to have a BP average < 140/80 Medicine: HCTZ, no changes, please take medication next time you come in

## 2016-06-13 LAB — MICROALBUMIN / CREATININE URINE RATIO
Creatinine, Urine: 78 mg/dL (ref 20–320)
Microalb Creat Ratio: 51 mcg/mg creat — ABNORMAL HIGH (ref <30)
Microalb, Ur: 4 mg/dL

## 2016-06-18 ENCOUNTER — Telehealth: Payer: Self-pay | Admitting: Internal Medicine

## 2016-06-18 MED ORDER — ATORVASTATIN CALCIUM 40 MG PO TABS: 40.0000 mg | ORAL_TABLET | Freq: Every day | ORAL | 3 refills | Status: DC

## 2016-06-18 NOTE — Telephone Encounter (Addendum)
Discussed the results of patient's labs. Specifically her elevated microalbuminuria and lipid panel. She is reticent to start any other medications. I recommended a statin due her elevated ASCVD risk.  Furthermore I recommended changing her HCTZ blood pressure medication to lisinopril at her next appointment with me. She stated that I could prescribe the statin, and she would decide whether she wanted to take it or not after further research. She was amenable to the changes in her blood pressure medication.

## 2016-06-18 NOTE — Telephone Encounter (Signed)
Called patient to let her know that she

## 2016-09-18 ENCOUNTER — Encounter: Payer: Self-pay | Admitting: Internal Medicine

## 2016-09-18 ENCOUNTER — Ambulatory Visit (INDEPENDENT_AMBULATORY_CARE_PROVIDER_SITE_OTHER): Payer: BC Managed Care – PPO | Admitting: Internal Medicine

## 2016-09-18 VITALS — BP 116/80 | HR 74 | Temp 98.3°F | Ht 65.0 in | Wt 210.2 lb

## 2016-09-18 DIAGNOSIS — E1165 Type 2 diabetes mellitus with hyperglycemia: Secondary | ICD-10-CM

## 2016-09-18 DIAGNOSIS — IMO0001 Reserved for inherently not codable concepts without codable children: Secondary | ICD-10-CM

## 2016-09-18 LAB — LIPID PANEL
CHOL/HDL RATIO: 4 ratio (ref <5.0)
CHOLESTEROL: 167 mg/dL (ref <200)
HDL: 42 mg/dL — ABNORMAL LOW (ref >50)
LDL CALC: 99 mg/dL (ref <100)
TRIGLYCERIDES: 130 mg/dL (ref <150)
VLDL: 26 mg/dL (ref <30)

## 2016-09-18 LAB — POCT GLYCOSYLATED HEMOGLOBIN (HGB A1C): Hemoglobin A1C: 7.3

## 2016-09-18 NOTE — Patient Instructions (Signed)
You are doing a great job with your diet changes. Please follow up in 3 months, we will recheck labs today

## 2016-09-18 NOTE — Progress Notes (Signed)
   [facility] Cheyenne CharsAsiyah Cindee Mclester, MD Phone: [phone]  Reason For Visit: Diabetes Follow up   CHRONIC DM, Type 2: Reports no concerns CBGs: Does not check  Meds: Taking Metformin 1000 mg twice daily  Reports good compliance. Tolerating well w/o side-effects Lifestyle: Patient has changed diet completely since last appointment, has also been exercising daily Any hypoglycemia episodes: Denies palpations, diaphoresis, fatigue, weakness, jittery Denies polyuria, visual changes, numbness or tingling.  Past Medical History Reviewed problem list.  Medications- reviewed and updated No additions to family history Social history- patient is a non- smoker  Objective: BP 116/80   Pulse 74   Temp 98.3 F (36.8 C) (Oral)   Ht 5\' 5"  (1.651 m)   Wt 210 lb 3.2 oz (95.3 kg)   LMP 02/01/2015 (Exact Date)   BMI 34.98 kg/m  Gen: NAD, alert, cooperative with exam Skin: dry, intact, no rashes or lesions Neuro: Strength and sensation grossly intact  Assessment/Plan: See problem based a/p  Type 2 diabetes mellitus, uncontrolled A1C 13> 7.3 (today),improving. Patient has been exercising and improving her diet. At last visit, she wanted to work on diet and exercise changes and did not want to start ACE, Statin or aspirin  - Medication management -Metformin 1000 mg  - Recheck Lipid, microabuminuria, and A1C   - Please follow up in 3 months  - Patient is not currently on statin, aspirin or, ACE - ASCVD risk based on new lipid profile is 6.2%, less than 7.5% > lipid profile has improved significantly since last visit - Recommend a moderate intensity statin, however patient not interested  - Reviewed criteria for hypoglycemia

## 2016-09-19 LAB — MICROALBUMIN / CREATININE URINE RATIO
Creatinine, Urine: 48 mg/dL (ref 20–320)
Microalb, Ur: <0.2 mg/dL

## 2016-09-25 NOTE — Assessment & Plan Note (Addendum)
A1C 13> 7.3 (today),improving. Patient has been exercising and improving her diet. At last visit, she wanted to work on diet and exercise changes and did not want to start ACE, Statin or aspirin  - Medication management -Metformin 1000 mg  - Recheck Lipid, microabuminuria, and A1C   - Please follow up in 3 months  - Patient is not currently on statin, aspirin or, ACE - ASCVD risk based on new lipid profile is 6.2%, less than 7.5% > lipid profile has improved significantly since last visit - Recommend a moderate intensity statin, however patient not interested  - Reviewed criteria for hypoglycemia

## 2016-09-26 ENCOUNTER — Encounter: Payer: Self-pay | Admitting: Internal Medicine

## 2016-10-02 ENCOUNTER — Encounter: Payer: Self-pay | Admitting: Internal Medicine

## 2016-10-02 ENCOUNTER — Ambulatory Visit (INDEPENDENT_AMBULATORY_CARE_PROVIDER_SITE_OTHER): Payer: BC Managed Care – PPO | Admitting: Internal Medicine

## 2016-10-02 VITALS — BP 136/90 | HR 74 | Temp 98.1°F | Ht 65.0 in | Wt 210.6 lb

## 2016-10-02 DIAGNOSIS — M7989 Other specified soft tissue disorders: Secondary | ICD-10-CM | POA: Diagnosis not present

## 2016-10-02 NOTE — Progress Notes (Signed)
   [facility] Noralee CharsAsiyah Andreka Stucki, MD Phone: [phone]  Reason For Visit: SDA for Left Hand Swelling    # Woke up on Sunday morning with throbbing pain in left thumb.  - Slightly improved from Sunday  - Can move the finger without issues though painful - Felt slight warms, not erythematous  - No cuts or bites around the area  - Soaked esposm salts, has tried [employer] Tirealieve  - Throbbing pain, continuous  - No fever, no chills  - No trauma  - No hx of gout  - No autoimmune disease hx - No skin issues  - No hx of joint swelling   Past Medical History Reviewed problem list.  Medications- reviewed and updated No additions to family history Social history- patient is a non-smoker  Objective: BP 136/90 (BP Location: Right Arm, Patient Position: Sitting, Cuff Size: Normal)   Pulse 74   Temp 98.1 F (36.7 C) (Oral)   Ht 5\' 5"  (1.651 m)   Wt 210 lb 9.6 oz (95.5 kg)   LMP 02/01/2015 (Exact Date)   SpO2 96%   BMI 35.05 kg/m  Gen: NAD, alert, cooperative with exam Left Hand: Swollen left thumb specifically proximal phalanx joint, slightly warm to the touch/erthymetaous,  5/5 strength in the thumb. 2+ radial pulse Skin:slight swollen/erthymetaous proximal interphalangeal joint of 1st digit  Neuro: Strength and sensation grossly intact  Assessment/Plan: See problem based a/p  Swollen finger Slightly swollen left proximal interphalangeal joint most consistent with gout. No concerns for septic joint, able to move joint; no fevers; no possible site of infection. No hx polyarticular arthritis or hx of psoriasis therefore unlikely autoimmune   - will obtain uric acid - Continue ibuprofen every 600 mg every 6 hours for pain  - Holding off on colchicine as needs to be taken with 24 -48 hrs to be effective - discussed that if patient has another exacerbation in the future this might be helpful  - Discussed that HCTZ can exacerbate gout, however patient wanted to hold off on  changing this medication until uric acid is back

## 2016-10-02 NOTE — Assessment & Plan Note (Signed)
Slightly swollen left proximal interphalangeal joint most consistent with gout. No concerns for septic joint, able to move joint; no fevers; no possible site of infection. No hx polyarticular arthritis or hx of psoriasis therefore unlikely autoimmune   - will obtain uric acid - Continue ibuprofen every 600 mg every 6 hours for pain  - Holding off on colchicine as needs to be taken with 24 -48 hrs to be effective - discussed that if patient has another exacerbation in the future this might be helpful  - Discussed that HCTZ can exacerbate gout, however patient wanted to hold off on changing this medication until uric acid is back

## 2016-10-02 NOTE — Patient Instructions (Signed)
I would continue Alieve every 6 hours, gout should start to resolve in 1 week.  I want you to follow up at that time.    Gout Introduction Gout is painful swelling that can happen in some of your joints. Gout is a type of arthritis. This condition is caused by having too much uric acid in your body. Uric acid is a chemical that is made when your body breaks down substances called purines. If your body has too much uric acid, sharp crystals can form and build up in your joints. This causes pain and swelling. Gout attacks can happen quickly and be very painful (acute gout). Over time, the attacks can affect more joints and happen more often (chronic gout). Follow these instructions at home: During a Gout Attack  If directed, put ice on the painful area:  Put ice in a plastic bag.  Place a towel between your skin and the bag.  Leave the ice on for 20 minutes, 2-3 times a day.  Rest the joint as much as possible. If the joint is in your leg, you may be given crutches to use.  Raise (elevate) the painful joint above the level of your heart as often as you can.  Drink enough fluids to keep your pee (urine) clear or pale yellow.  Take over-the-counter and prescription medicines only as told by your doctor.  Do not drive or use heavy machinery while taking prescription pain medicine.  Follow instructions from your doctor about what you can or cannot eat and drink.  Return to your normal activities as told by your doctor. Ask your doctor what activities are safe for you. Avoiding Future Gout Attacks  Follow a low-purine diet as told by a specialist (dietitian) or your doctor. Avoid foods and drinks that have a lot of purines, such as:  Liver.  Kidney.  Anchovies.  Asparagus.  Herring.  Mushrooms  Mussels.  Beer.  Limit alcohol intake to no more than 1 drink a day for nonpregnant women and 2 drinks a day for men. One drink equals 12 oz of beer, 5 oz of wine, or 1 oz of hard  liquor.  Stay at a healthy weight or lose weight if you are overweight. If you want to lose weight, talk with your doctor. It is important that you do not lose weight too fast.  Start or continue an exercise plan as told by your doctor.  Drink enough fluids to keep your pee clear or pale yellow.  Take over-the-counter and prescription medicines only as told by your doctor.  Keep all follow-up visits as told by your doctor. This is important. Contact a doctor if:  You have another gout attack.  You still have symptoms of a gout attack after10 days of treatment.  You have problems (side effects) because of your medicines.  You have chills or a fever.  You have burning pain when you pee (urinate).  You have pain in your lower back or belly. Get help right away if:  You have very bad pain.  Your pain cannot be controlled.  You cannot pee. This information is not intended to replace advice given to you by your health care provider. Make sure you discuss any questions you have with your health care provider. Document Released: 07/24/2008 Document Revised: 03/22/2016 Document Reviewed: 07/28/2015  2017 [employer]

## 2016-10-03 LAB — URIC ACID: Uric Acid, Serum: 4.7 mg/dL (ref 2.5–7.0)

## 2016-10-25 ENCOUNTER — Other Ambulatory Visit: Payer: Self-pay | Admitting: Internal Medicine

## 2016-10-25 ENCOUNTER — Telehealth: Payer: Self-pay

## 2016-10-25 MED ORDER — METFORMIN HCL 500 MG PO TABS: ORAL_TABLET | ORAL | 6 refills | Status: DC

## 2016-10-25 NOTE — Telephone Encounter (Signed)
Needs refill on Metformin. Doesn't have enough for the weekend. Please advise. Sunday SpillersSharon T Saunders, CMA

## 2016-11-11 ENCOUNTER — Other Ambulatory Visit: Payer: Self-pay | Admitting: Internal Medicine

## 2017-05-06 ENCOUNTER — Other Ambulatory Visit: Payer: Self-pay | Admitting: Internal Medicine

## 2017-05-06 MED ORDER — METFORMIN HCL 500 MG PO TABS: ORAL_TABLET | ORAL | 6 refills | Status: DC

## 2017-05-06 MED ORDER — HYDROCHLOROTHIAZIDE 25 MG PO TABS: 25.0000 mg | ORAL_TABLET | Freq: Every day | ORAL | 5 refills | Status: DC

## 2017-05-06 NOTE — Telephone Encounter (Signed)
Pt is calling because she has transferred to a NEW Pharmacy. She is now using [facility] [address]. Can we send in her Metformin and Hydrochlorothiazide. jw

## 2017-09-11 ENCOUNTER — Other Ambulatory Visit: Payer: Self-pay | Admitting: Internal Medicine

## 2017-11-25 ENCOUNTER — Other Ambulatory Visit: Payer: Self-pay | Admitting: Internal Medicine

## 2017-11-25 NOTE — Telephone Encounter (Signed)
Refill sent. Please call patient had have her follow up with PCP. She has not been seen in clinic in a while

## 2017-11-27 NOTE — Telephone Encounter (Signed)
Pt informed. Stated she would call back and make an appt. Denney Shein Bruna PotterBlount, CMA

## 2017-12-30 ENCOUNTER — Other Ambulatory Visit: Payer: Self-pay | Admitting: *Deleted

## 2017-12-30 MED ORDER — METFORMIN HCL 500 MG PO TABS: ORAL_TABLET | ORAL | 3 refills | Status: AC

## 2018-04-22 ENCOUNTER — Other Ambulatory Visit: Payer: Self-pay | Admitting: Internal Medicine

## 2018-06-02 ENCOUNTER — Other Ambulatory Visit: Payer: Self-pay | Admitting: Internal Medicine

## 2018-12-13 ENCOUNTER — Other Ambulatory Visit: Payer: Self-pay | Admitting: Family Medicine

## 2019-01-14 ENCOUNTER — Other Ambulatory Visit: Payer: Self-pay | Admitting: Family Medicine

## 2019-02-13 ENCOUNTER — Other Ambulatory Visit: Payer: Self-pay | Admitting: Family Medicine

## 2019-03-14 ENCOUNTER — Other Ambulatory Visit: Payer: Self-pay | Admitting: Family Medicine

## 2019-03-16 ENCOUNTER — Other Ambulatory Visit: Payer: Self-pay

## 2019-03-16 MED ORDER — METFORMIN HCL 500 MG PO TABS: 1000.0000 mg | ORAL_TABLET | Freq: Two times a day (BID) | ORAL | 1 refills | Status: AC

## 2019-04-12 ENCOUNTER — Other Ambulatory Visit: Payer: Self-pay | Admitting: Family Medicine

## 2019-05-12 ENCOUNTER — Other Ambulatory Visit: Payer: Self-pay | Admitting: Family Medicine

## 2019-05-13 ENCOUNTER — Telehealth: Payer: Self-pay | Admitting: *Deleted

## 2019-05-13 NOTE — Telephone Encounter (Signed)
-----   Message from Nuala Alpha, DO sent at 05/12/2019  3:43 PM EDT ----- Regarding: Patient needs an appointment Can we please call this patient and let her know I will refill her HCTZ but she needs to come in to be seen and have blood work? Thanks!  Tim

## 2019-05-13 NOTE — Telephone Encounter (Signed)
Spoke with pt and she states that she no longer gets care from [place]. Deseree Kennon Holter, CMA

## 2019-08-25 ENCOUNTER — Other Ambulatory Visit: Payer: Self-pay | Admitting: Family Medicine

## 2019-08-25 DIAGNOSIS — Z1231 Encounter for screening mammogram for malignant neoplasm of breast: Secondary | ICD-10-CM

## 2019-10-15 ENCOUNTER — Other Ambulatory Visit: Payer: Self-pay

## 2019-10-15 ENCOUNTER — Ambulatory Visit
Admission: RE | Admit: 2019-10-15 | Discharge: 2019-10-15 | Disposition: A | Discharge status: Home / Self Care | Payer: BC Managed Care – PPO | Source: Ambulatory Visit | Attending: Family Medicine | Admitting: Family Medicine

## 2019-10-15 DIAGNOSIS — Z1231 Encounter for screening mammogram for malignant neoplasm of breast: Secondary | ICD-10-CM

## 2020-07-13 IMAGING — MG DIGITAL SCREENING BILAT W/ TOMO W/ CAD
8 series · 8 of 24 positions shown · non-contrast
Comparison: Previous exam(s).

CLINICAL DATA: Screening.

EXAM:
DIGITAL SCREENING BILATERAL MAMMOGRAM WITH TOMO AND CAD

[R CC synth-2D]
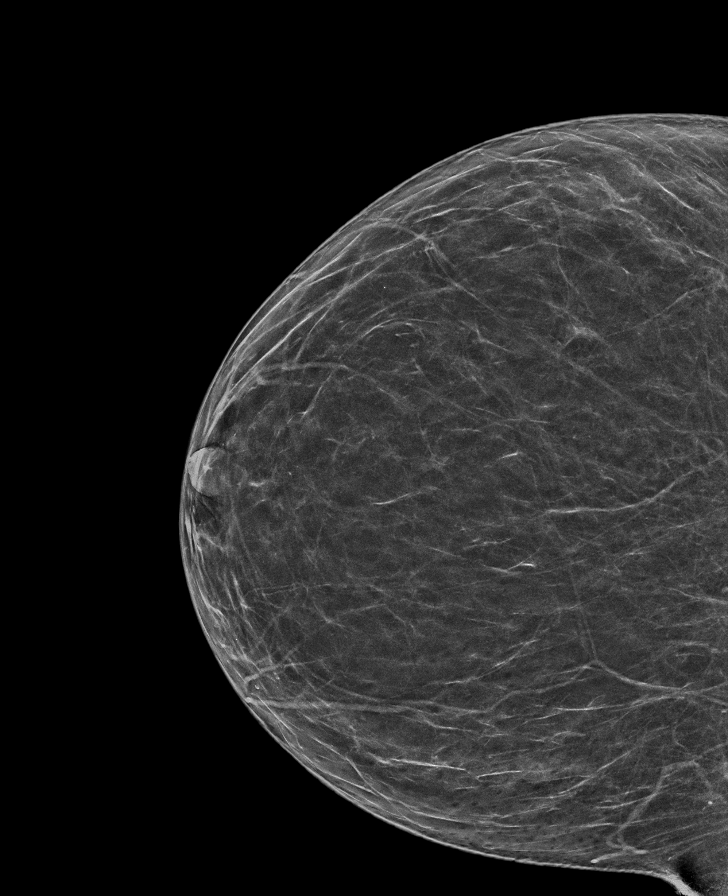

[R MLO synth-2D]
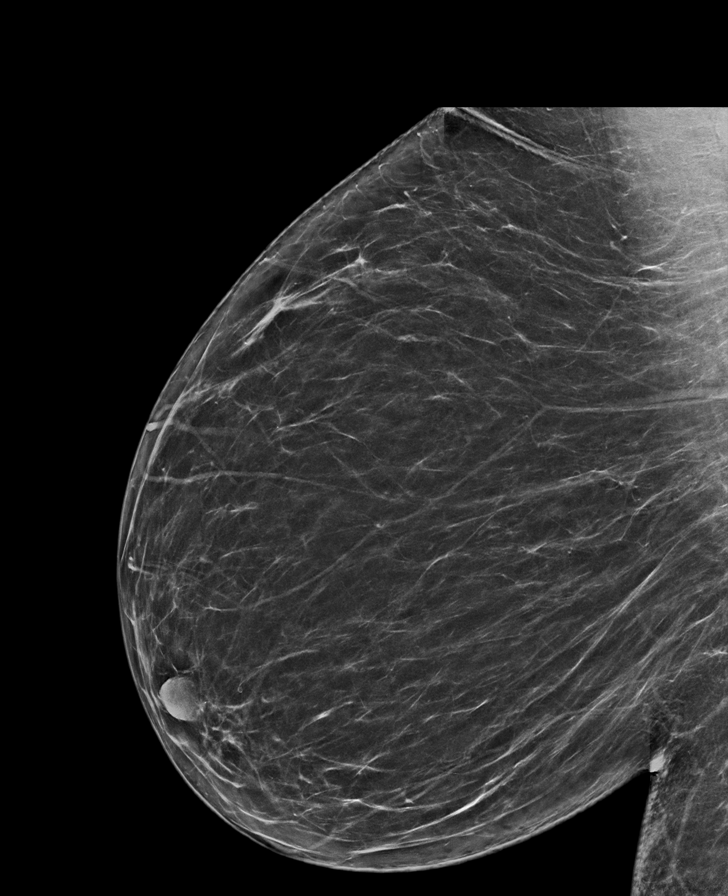

[L CC synth-2D]
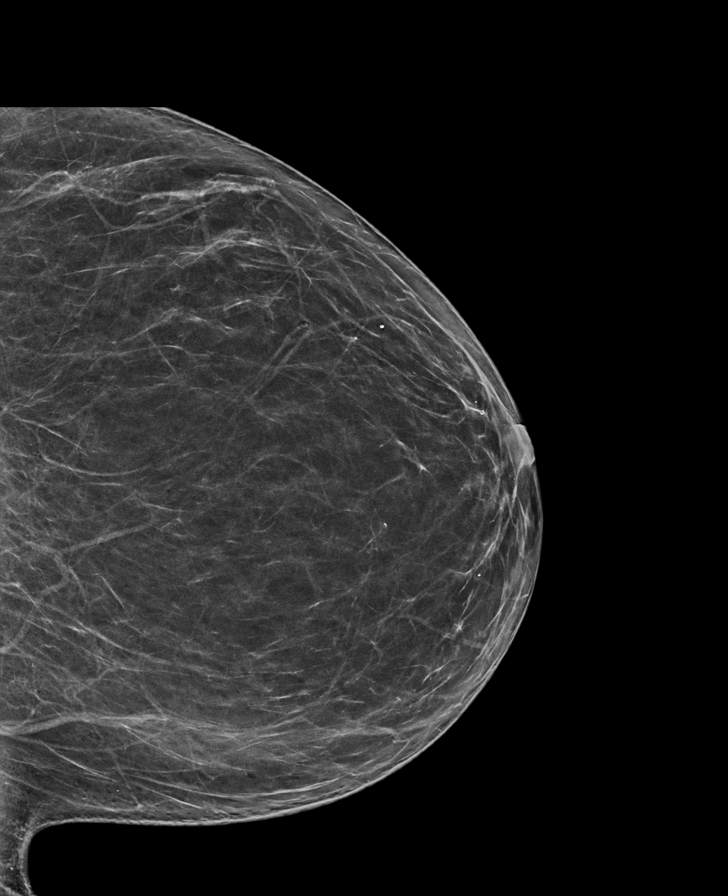

[L MLO synth-2D]
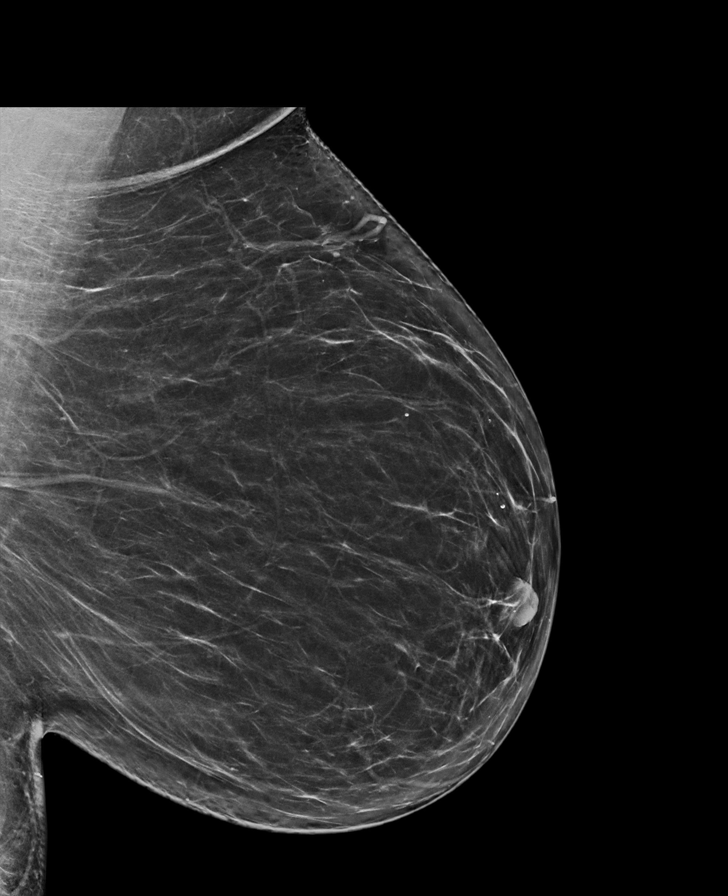

[R CC tomo · tomo slice 35/70.0]
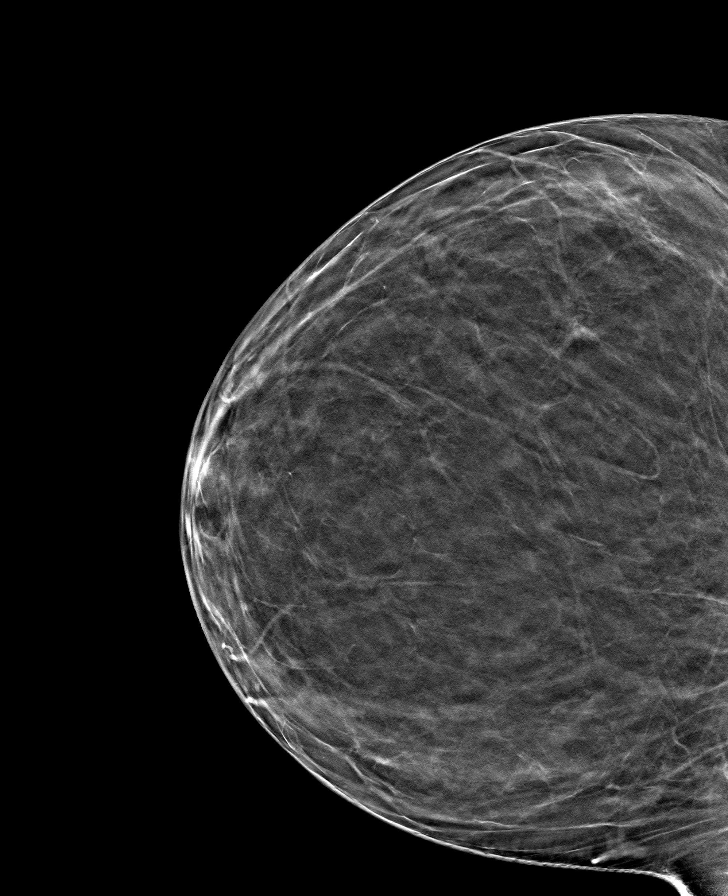

[R MLO tomo · tomo slice 38/75.0]
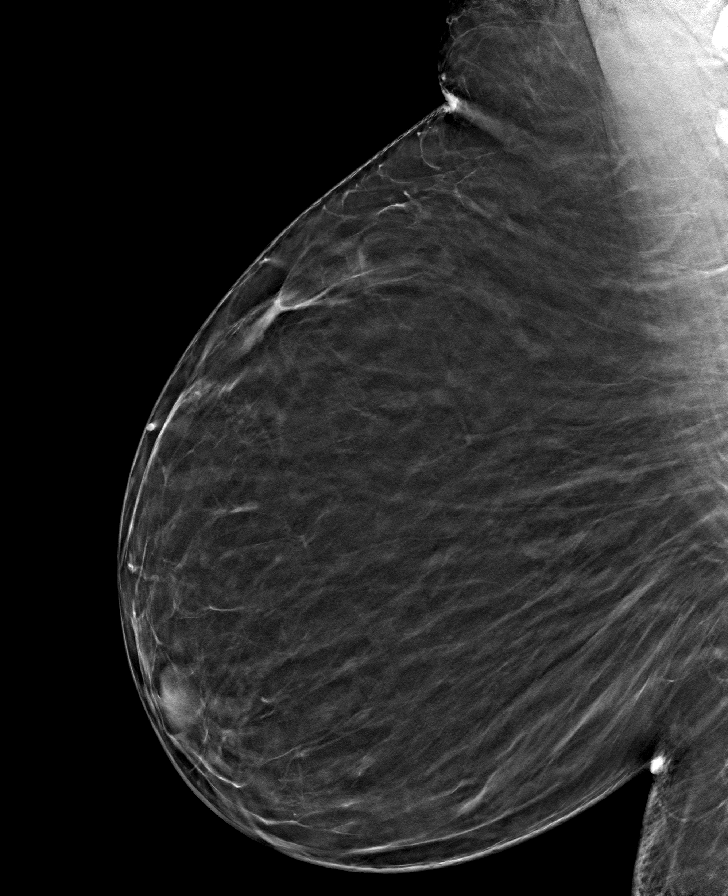

[L CC tomo · tomo slice 35/70.0]
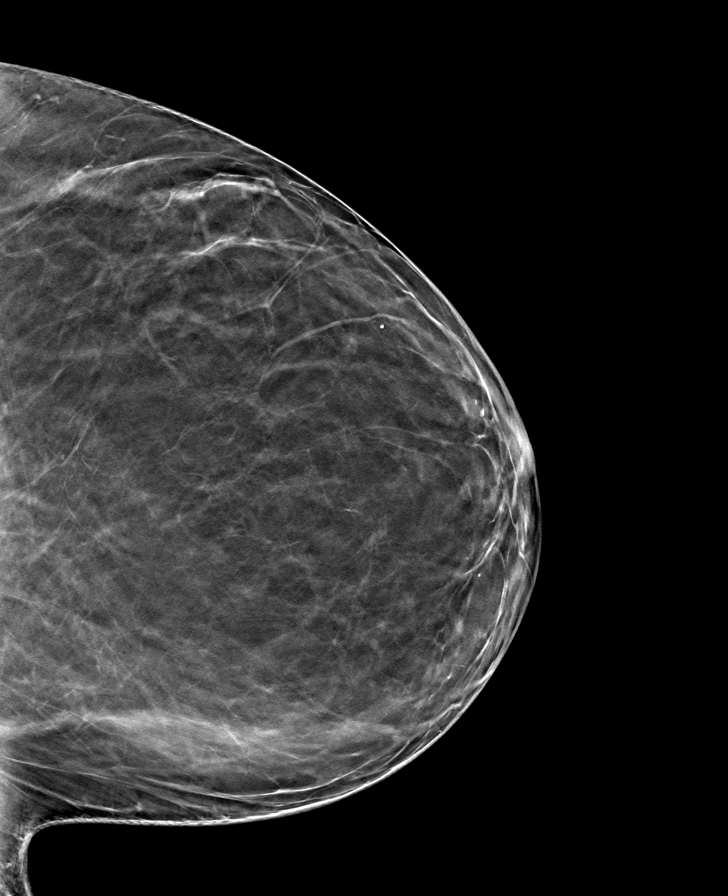

[L MLO tomo · tomo slice 39/76.0]
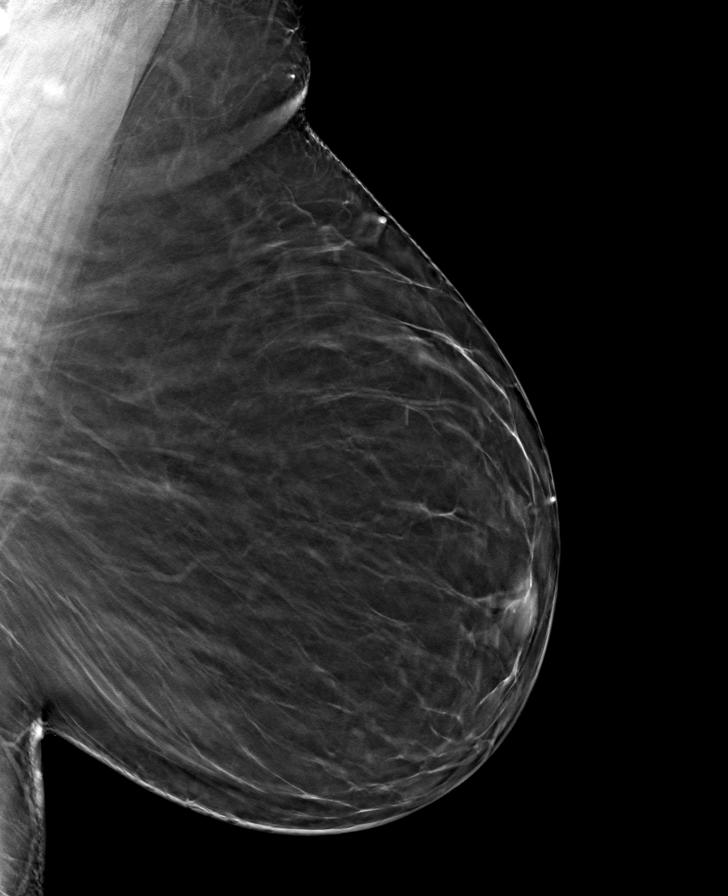

[8 of 24 positions shown; findings below may reference images not displayed]

ACR Breast Density Category b: There are scattered areas of
fibroglandular density.
FINDINGS: There are no findings suspicious for malignancy. Images were
processed with CAD.
IMPRESSION: No mammographic evidence of malignancy. A result letter of this
screening mammogram will be mailed directly to the patient.

RECOMMENDATION:
Screening mammogram in one year. (Code:[id number])

BI-RADS CATEGORY  1: Negative.

## 2022-03-16 ENCOUNTER — Other Ambulatory Visit: Payer: Self-pay | Admitting: Family Medicine

## 2022-03-16 DIAGNOSIS — Z1231 Encounter for screening mammogram for malignant neoplasm of breast: Secondary | ICD-10-CM

## 2022-04-03 ENCOUNTER — Encounter: Payer: Self-pay | Admitting: *Deleted
